# Patient Record
Sex: Female | Born: 1965 | Race: Black or African American | Hispanic: No | Marital: Single | Smoking: Current every day smoker
Health system: Southern US, Community
[De-identification: ages and names within clinical notes are randomized; demographics above are authoritative.]

## PROBLEM LIST (undated history)

## (undated) DIAGNOSIS — I10 Essential (primary) hypertension: Secondary | ICD-10-CM

## (undated) DIAGNOSIS — F32A Depression, unspecified: Secondary | ICD-10-CM

## (undated) DIAGNOSIS — J449 Chronic obstructive pulmonary disease, unspecified: Secondary | ICD-10-CM

## (undated) DIAGNOSIS — F4310 Post-traumatic stress disorder, unspecified: Secondary | ICD-10-CM

## (undated) DIAGNOSIS — F419 Anxiety disorder, unspecified: Secondary | ICD-10-CM

## (undated) HISTORY — PX: TUBAL LIGATION: SHX77

---

## 2020-03-26 ENCOUNTER — Encounter: Admit: 2020-03-26 | Payer: PRIVATE HEALTH INSURANCE | Attending: Pulmonary Disease

## 2020-03-26 DIAGNOSIS — J449 Chronic obstructive pulmonary disease, unspecified: Secondary | ICD-10-CM

## 2020-04-02 ENCOUNTER — Encounter: Admit: 2020-04-02 | Payer: PRIVATE HEALTH INSURANCE

## 2020-04-02 ENCOUNTER — Inpatient Hospital Stay: Admit: 2020-04-02 | Discharge: 2020-04-02 | Payer: MEDICAID

## 2020-04-02 ENCOUNTER — Emergency Department: Admit: 2020-04-02 | Payer: PRIVATE HEALTH INSURANCE

## 2020-04-02 DIAGNOSIS — R519 Headache, unspecified: Secondary | ICD-10-CM

## 2020-04-02 DIAGNOSIS — F39 Unspecified mood [affective] disorder: Secondary | ICD-10-CM

## 2020-04-02 DIAGNOSIS — F1910 Other psychoactive substance abuse, uncomplicated: Secondary | ICD-10-CM

## 2020-04-02 DIAGNOSIS — J439 Emphysema, unspecified: Secondary | ICD-10-CM

## 2020-04-02 DIAGNOSIS — F32A Depression: Secondary | ICD-10-CM

## 2020-04-02 DIAGNOSIS — J449 Chronic obstructive pulmonary disease, unspecified: Secondary | ICD-10-CM

## 2020-04-02 DIAGNOSIS — F419 Anxiety disorder, unspecified: Secondary | ICD-10-CM

## 2020-04-02 DIAGNOSIS — I10 Essential (primary) hypertension: Secondary | ICD-10-CM

## 2020-04-02 LAB — CBC WITH AUTO DIFFERENTIAL
BKR WAM ABSOLUTE IMMATURE GRANULOCYTES: 0 x 1000/ÂµL (ref 0.0–0.3)
BKR WAM ABSOLUTE LYMPHOCYTE COUNT: 1.8 x 1000/ÂµL (ref 1.0–4.0)
BKR WAM ABSOLUTE NRBC: 0 x 1000/ÂµL (ref 0.0–0.0)
BKR WAM ANALYZER ANC: 5.4 x 1000/ÂµL (ref 1.0–11.0)
BKR WAM BASOPHIL ABSOLUTE COUNT: 0 x 1000/ÂµL (ref 0.0–0.0)
BKR WAM BASOPHILS: 0.5 % (ref 0.0–4.0)
BKR WAM EOSINOPHIL ABSOLUTE COUNT: 0.1 x 1000/ÂµL (ref 0.0–1.0)
BKR WAM EOSINOPHILS: 0.8 % (ref 0.0–7.0)
BKR WAM HEMATOCRIT: 38.1 % (ref 37.0–52.0)
BKR WAM HEMOGLOBIN: 12.6 g/dL (ref 12.0–18.0)
BKR WAM IMMATURE GRANULOCYTES: 0.1 % (ref 0.0–3.0)
BKR WAM LYMPHOCYTES: 23.6 % (ref 8.0–49.0)
BKR WAM MCH (PG): 31 pg (ref 27.0–31.0)
BKR WAM MCHC: 33.1 g/dL (ref 31.0–36.0)
BKR WAM MCV: 93.6 fL (ref 78.0–94.0)
BKR WAM MONOCYTE ABSOLUTE COUNT: 0.4 x 1000/ÂµL (ref 0.0–2.0)
BKR WAM MONOCYTES: 5.5 % (ref 4.0–15.0)
BKR WAM MPV: 9.6 fL (ref 6.0–11.0)
BKR WAM NEUTROPHILS: 69.5 % (ref 37.0–84.0)
BKR WAM NUCLEATED RED BLOOD CELLS: 0 % (ref 0.0–1.0)
BKR WAM PLATELETS: 306 x1000/ÂµL (ref 140–440)
BKR WAM RDW-CV: 13.4 % (ref 11.5–14.5)
BKR WAM RED BLOOD CELL COUNT: 4.1 M/ÂµL (ref 3.8–5.9)
BKR WAM WHITE BLOOD CELL COUNT: 7.8 x1000/ÂµL (ref 4.0–10.0)

## 2020-04-02 LAB — BASIC METABOLIC PANEL
BKR ANION GAP: 10 (ref 7–17)
BKR BLOOD UREA NITROGEN: 12 mg/dL (ref 6–20)
BKR BUN / CREAT RATIO: 12 (ref 8.0–23.0)
BKR CALCIUM: 9.4 mg/dL (ref 8.8–10.2)
BKR CHLORIDE: 104 mmol/L (ref 98–107)
BKR CO2: 25 mmol/L (ref 20–30)
BKR CREATININE: 1 mg/dL (ref 0.40–1.30)
BKR EGFR (AFR AMER): >60 mL/min/{1.73_m2} (ref >60)
BKR EGFR (NON AFRICAN AMERICAN): 58 mL/min/{1.73_m2} (ref >60)
BKR GLUCOSE: 113 mg/dL — ABNORMAL HIGH (ref 70–100)
BKR POTASSIUM: 4.2 mmol/L (ref 3.3–5.1)
BKR SODIUM: 139 mmol/L (ref 136–144)

## 2020-04-02 LAB — HEPATIC FUNCTION PANEL
BKR A/G RATIO: 1.2 (ref 1.0–2.2)
BKR ALANINE AMINOTRANSFERASE (ALT): 21 U/L (ref 10–35)
BKR ALBUMIN: 4.1 g/dL (ref 3.6–4.9)
BKR ALKALINE PHOSPHATASE: 92 U/L (ref 9–122)
BKR ASPARTATE AMINOTRANSFERASE (AST): 30 U/L (ref 10–35)
BKR AST/ALT RATIO: 1.4
BKR BILIRUBIN DIRECT: <0.2 mg/dL (ref <=0.3)
BKR BILIRUBIN TOTAL: 0.2 mg/dL (ref <=1.2)
BKR GLOBULIN: 3.4 g/dL (ref 2.3–3.5)
BKR PROTEIN TOTAL: 7.5 g/dL (ref 6.6–8.7)

## 2020-04-02 LAB — PROTIME AND INR
BKR INR: 0.91 (ref 0.90–1.09)
BKR PROTHROMBIN TIME: 10.2 s (ref 10.2–12.2)

## 2020-04-02 MED ORDER — SODIUM CHLORIDE 0.9 % BOLUS (NEW BAG)
0.9 % | INTRAVENOUS | Status: CP
Start: 2020-04-02 — End: ?
  Administered 2020-04-02: 20:00:00 0.9 mL/h via INTRAVENOUS

## 2020-04-02 MED ORDER — METOCLOPRAMIDE 5 MG/ML INJECTION SOLUTION
5 mg/mL | INTRAVENOUS | Status: CP
Start: 2020-04-02 — End: ?
  Administered 2020-04-02: 20:00:00 5 mL via INTRAVENOUS

## 2020-04-02 MED ORDER — ACETAMINOPHEN 325 MG TABLET
325 mg | Freq: Once | ORAL | Status: CP
Start: 2020-04-02 — End: ?
  Administered 2020-04-02: 20:00:00 325 mg via ORAL

## 2020-04-02 MED ORDER — LABETALOL 5 MG/ML INTRAVENOUS SOLUTION
5 mg/mL | Freq: Once | INTRAVENOUS | Status: CP
Start: 2020-04-02 — End: ?
  Administered 2020-04-02: 20:00:00 5 mL via INTRAVENOUS

## 2020-04-02 MED ORDER — HYDROCHLOROTHIAZIDE 25 MG TABLET
25 mg | ORAL_TABLET | Freq: Every day | ORAL | 1 refills | Status: AC
Start: 2020-04-02 — End: ?

## 2020-04-02 NOTE — ED Notes
2:39 PM Pt presents from PCP for evaluation of HTN. Pt has been non complaint with BP meds over the last year. Pt was hypertensive at clinic and sent in to ED for eval.  States over the last month pt has had worsening headaches and intermittent blurred vision in left eye more than the right. Pt presents to ED speaking in full sentences. Denies CP, SOB, abd pain, N/V/D, fevers/chills, Appears in NAD. Pt admits to crack cocaine use last night and daily marijuana use. Pt distracted by cell phone during intial interview. Will continue to monitor. Awaiting MD eval 2:44 PMPA bedside for eval

## 2020-04-02 NOTE — ED Notes
5:23 PM Airyn Ellzey, RNPt medicated per MAR, pending imaging results. 5:58 PMPt expressing relief of symptoms including headache. Pt BP seen to minimally 7:06 PMReport given to [facility].

## 2020-04-02 NOTE — Discharge Instructions
Return to ED if you have fever, chest pain, dizziness, shortness of breath, leg pain/swelling

## 2020-04-03 DIAGNOSIS — I10 Essential (primary) hypertension: Secondary | ICD-10-CM

## 2020-04-03 DIAGNOSIS — H538 Other visual disturbances: Secondary | ICD-10-CM

## 2020-04-03 DIAGNOSIS — F17290 Nicotine dependence, other tobacco product, uncomplicated: Secondary | ICD-10-CM

## 2020-04-03 DIAGNOSIS — Z79899 Other long term (current) drug therapy: Secondary | ICD-10-CM

## 2020-04-03 DIAGNOSIS — R519 Headache, unspecified: Secondary | ICD-10-CM

## 2020-04-03 DIAGNOSIS — F17210 Nicotine dependence, cigarettes, uncomplicated: Secondary | ICD-10-CM

## 2020-04-03 DIAGNOSIS — J439 Emphysema, unspecified: Secondary | ICD-10-CM

## 2020-04-04 ENCOUNTER — Encounter: Admit: 2020-04-04 | Payer: PRIVATE HEALTH INSURANCE | Attending: Acute Care

## 2020-04-04 DIAGNOSIS — Z01818 Encounter for other preprocedural examination: Secondary | ICD-10-CM

## 2020-04-15 NOTE — ED Provider Notes
HistoryChief Complaint Patient presents with ? Headache- New Onset Or New Symptoms   Patient complaining of a headache. Found to have a BP of 195/116. Patient has not taken BP meds in over a year.   33f hx anxiety, copd, current smoker, htn, depression, mood disorder, crack cocaine user (last use was last night), here with 2 weeks of new hs's that are intermittent L occip ha that comes out of nowhere and is 8/10, assoc w 2 weeks of constant L eye blurry vision- describes as a static like her eye is about to pass out, sometimes has flashing lights to the eye, no eye pain- reports a few days ago she had bright red to the white part of the L eye which is now resolved. She was put on hctz 1 year ago but never took it. Called her doctors office 2 weeks ago when her ha's began and they sent her a bp cuff to monitor pressures- bp has been 200/100's so was directed to the ed for further eval. Admits to intermittent pulling pressure like pain to chest, none now. Denies dizziness, paresthesias, focal weakness, gait change, palpitations, cough, sob, fever, chills, n/v, abd pain, constipation, diarrhea, urinary sxs, leg pain/swelling. The history is provided by the patient. No language interpreter was used. HeadacheThis is a new problem. The current episode started more than 1 week ago. The problem has not changed since onset.Associated symptoms include chest pain and headaches. Pertinent negatives include no abdominal pain and no shortness of breath. Nothing aggravates the symptoms. Nothing relieves the symptoms. She has tried nothing for the symptoms.  Past Medical History: Diagnosis Date ? Anxiety  ? COPD (chronic obstructive pulmonary disease) (HC Code)  ? Depression  ? Emphysema  ? HTN (hypertension)  ? Mood disorder (HC Code)  ? Substance abuse (HC Code)  Past Surgical History: Procedure Laterality Date ? TUBAL LIGATION   Family History Problem Relation Age of Onset ? Alcohol abuse Mother  ? Drug abuse Mother  ? Depression Mother  ? Heart attack Mother       cause ? Hypertension Mother  ? Lung cancer Mother 8 ? Ovarian cancer Mother 92 ? Drug abuse Father  ? Mental illness Father  ? Heart attack Maternal Grandfather  ? Diabetes Maternal Grandfather  ? Kidney disease Maternal Grandfather  ? Hypertension Sister  ? Depression Sister  ? Breast cancer Neg Hx  Social History Socioeconomic History ? Marital status: Single   Spouse name: Not on file ? Number of children: Not on file ? Years of education: Not on file ? Highest education level: Not on file Tobacco Use ? Smoking status: Current Every Day Smoker   Packs/day: 1.00   Types: Cigarettes ? Smokeless tobacco: Current User Substance and Sexual Activity ? Alcohol use: Yes   Frequency: 2-4 times a month   Comment: when drinking ? Drug use: Yes   Types: Crack cocaine, Marijuana   Comment: Marijuana everyday and crack every other day.  ? Sexual activity: Not Currently   Partners: Male   Birth control/protection: Surgical Social History Narrative  Substance abuse : crack cocaine x 30+ years: in group therpy ED Other Social History ? E-cigarette status Never User  ? E-Cigarette Use Never User  E-cigarette/Vaping Substances E-cigarette/Vaping Devices Review of Systems Respiratory: Negative for shortness of breath.  Cardiovascular: Positive for chest pain. Gastrointestinal: Negative for abdominal pain. Neurological: Positive for headaches. All other systems reviewed and are negative. Physical ExamED Triage Vitals [04/02/20 1421]BP: (!) 195/116Pulse: 70Pulse from  O2 sat: n/aResp: 16Temp: 97.9 ?F (36.6 ?C)Temp src: OralSpO2: 99 % BP (!) 176/90  - Pulse 62  - Temp 98.1 ?F (36.7 ?C) (Oral)  - Resp 18  - Wt 98.9 kg  - LMP  (LMP Unknown)  - SpO2 100%  - BMI 35.73 kg/m? Physical ExamVitals signs and nursing note reviewed. Constitutional:     General: She is not in acute distress.   Appearance: Normal appearance. She is normal weight. She is not ill-appearing, toxic-appearing or diaphoretic. HENT:    Head: Normocephalic and atraumatic.    Right Ear: External ear normal.    Left Ear: External ear normal.    Nose: Nose normal.    Mouth/Throat:    Mouth: Mucous membranes are moist.    Pharynx: Oropharynx is clear. Eyes:    General: No scleral icterus.      Right eye: No discharge.       Left eye: No discharge.    Extraocular Movements: Extraocular movements intact.    Conjunctiva/sclera: Conjunctivae normal.    Pupils: Pupils are equal, round, and reactive to light.    Comments: No nystagmus, nonpainful eom'sIOP to the L eye 14, 15 Visual acuity: R and L eye 20/25 without correction attmepted fundoscopic exam but unable to visualize retina- appears cloudy, could be cataract Neck:    Musculoskeletal: Normal range of motion and neck supple. Cardiovascular:    Rate and Rhythm: Normal rate and regular rhythm.    Pulses: Normal pulses.    Heart sounds: Normal heart sounds. No murmur. No friction rub. No gallop.  Pulmonary:    Effort: Pulmonary effort is normal. No respiratory distress.    Breath sounds: Normal breath sounds. No stridor. No wheezing, rhonchi or rales. Chest:    Chest wall: No tenderness. Abdominal:    General: Abdomen is flat. Bowel sounds are normal. There is no distension.    Palpations: Abdomen is soft.    Tenderness: There is no abdominal tenderness. There is no right CVA tenderness or left CVA tenderness. Musculoskeletal: Normal range of motion.       General: No swelling or tenderness. Skin:   General: Skin is warm and dry.    Capillary Refill: Capillary refill takes less than 2 seconds. Neurological:    General: No focal deficit present.    Mental Status: She is alert and oriented to person, place, and time.    GCS: GCS eye subscore is 4. GCS verbal subscore is 5. GCS motor subscore is 6.    Cranial Nerves: Cranial nerves are intact.    Sensory: Sensation is intact.    Motor: Motor function is intact.    Coordination: Coordination is intact. Romberg sign negative. Finger-Nose-Finger Test normal.    Gait: Gait is intact. Gait and tandem walk normal.    Comments: No driftNo neglect Psychiatric:       Mood and Affect: Mood normal.       Behavior: Behavior normal.  ProceduresProceduresResident/APP ZOX:[id number] & Plan59f here with new onse L ha with L blurry vision for 2 weeks , has been having elevated bp's since monitoring Ddx: htnsive urgency, uncontrolled htn, migraine, tension ha, ich, brain mass, pheochromocytoma unlikelyP: labs, cxr, cth, ua, ekg, migraine cocktail, reassessD/w dr Frederik Pear afte rmeds pt bp improved 176/90, she feels improved and is eager to go home, will have her f/u w [facility] for likely cataract ED CourseClinical Impressions as of Apr 02 1920 Nonintractable headache, unspecified chronicity pattern, unspecified headache type Hypertension, unspecified type Change  in vision  ED DispositionDischarge McNiff, Lucilla Lame, PA06/08/21 1921ED Attestation: PA/APRNFace to face evaluation was performed by me in collaboration with the Advanced Practice Provider to assess for significant health threats.On my exam: nonfocal neuro exam, treated for HA, HTN with improvementMy differential includes: migraine, tension HA, hypertensive urgency vs emergencyJeffrey Jillene Bucks, MD06/21/21 1042

## 2020-04-18 ENCOUNTER — Ambulatory Visit: Admit: 2020-04-18 | Payer: PRIVATE HEALTH INSURANCE

## 2020-04-19 ENCOUNTER — Encounter: Admit: 2020-04-19 | Payer: PRIVATE HEALTH INSURANCE | Attending: Acute Care

## 2020-04-19 DIAGNOSIS — Z01818 Encounter for other preprocedural examination: Secondary | ICD-10-CM

## 2020-04-26 ENCOUNTER — Ambulatory Visit: Admit: 2020-04-26 | Payer: PRIVATE HEALTH INSURANCE

## 2020-07-19 ENCOUNTER — Emergency Department (HOSPITAL_COMMUNITY): Payer: Medicaid - Out of State

## 2020-07-19 ENCOUNTER — Other Ambulatory Visit: Payer: Self-pay

## 2020-07-19 ENCOUNTER — Encounter (HOSPITAL_COMMUNITY): Payer: Self-pay | Admitting: Emergency Medicine

## 2020-07-19 ENCOUNTER — Emergency Department (HOSPITAL_COMMUNITY)
Admission: EM | Admit: 2020-07-19 | Discharge: 2020-07-20 | Disposition: A | Discharge status: Home / Self Care | Payer: Medicaid - Out of State | Attending: Emergency Medicine | Admitting: Emergency Medicine

## 2020-07-19 DIAGNOSIS — S61210A Laceration without foreign body of right index finger without damage to nail, initial encounter: Secondary | ICD-10-CM | POA: Diagnosis present

## 2020-07-19 DIAGNOSIS — S61200A Unspecified open wound of right index finger without damage to nail, initial encounter: Secondary | ICD-10-CM | POA: Insufficient documentation

## 2020-07-19 DIAGNOSIS — W260XXA Contact with knife, initial encounter: Secondary | ICD-10-CM | POA: Diagnosis not present

## 2020-07-19 DIAGNOSIS — Y93G3 Activity, cooking and baking: Secondary | ICD-10-CM | POA: Diagnosis not present

## 2020-07-19 DIAGNOSIS — Z23 Encounter for immunization: Secondary | ICD-10-CM | POA: Diagnosis not present

## 2020-07-19 DIAGNOSIS — S61209A Unspecified open wound of unspecified finger without damage to nail, initial encounter: Secondary | ICD-10-CM

## 2020-07-19 MED ORDER — TETANUS-DIPHTH-ACELL PERTUSSIS 5-2.5-18.5 LF-MCG/0.5 IM SUSP
0.5000 mL | Freq: Once | INTRAMUSCULAR | Status: AC
  Administered 2020-07-20: 0.5 mL via INTRAMUSCULAR
  Filled 2020-07-19: qty 0.5

## 2020-07-19 NOTE — ED Provider Notes (Signed)
[facility]-EMERGENCY DEPT [facility] Emergency Department Provider Note MRN:  [id number]  Arrival date & time: 07/19/20     Chief Complaint   Extremity Laceration   History of Present Illness   Joyce Ballard is a 54 y.o. year-old female with no pertinent past medical history presenting to the ED with chief complaint of extremity laceration.  Right pointer finger cut with a knife while patient was cutting vegetables.  Unable to stop the bleeding since that time.  Denies any other injuries, unknown timing of last tetanus shot.  Pain is mild to moderate, constant, worse with motion or palpation.  Review of Systems  A problem-focused ROS was performed. Positive for finger laceration.  Patient denies head trauma.  Patient's Health History   History reviewed. No pertinent past medical history.  History reviewed. No pertinent surgical history.  History reviewed. No pertinent family history.  Social History   Socioeconomic History  . Marital status: Single    Spouse name: Not on file  . Number of children: Not on file  . Years of education: Not on file  . Highest education level: Not on file  Occupational History  . Not on file  Tobacco Use  . Smoking status: Never Smoker  . Smokeless tobacco: Never Used  Vaping Use  . Vaping Use: Never used  Substance and Sexual Activity  . Alcohol use: Never  . Drug use: Never  . Sexual activity: Not on file  Other Topics Concern  . Not on file  Social History Narrative  . Not on file   Social Determinants of Health   Financial Resource Strain:   . Difficulty of Paying Living Expenses: Not on file  Food Insecurity:   . Worried About Programme researcher, broadcasting/film/video in the Last Year: Not on file  . Ran Out of Food in the Last Year: Not on file  Transportation Needs:   . Lack of Transportation (Medical): Not on file  . Lack of Transportation (Non-Medical): Not on file  Physical Activity:   . Days of Exercise per Week: Not on file  . Minutes of  Exercise per Session: Not on file  Stress:   . Feeling of Stress : Not on file  Social Connections:   . Frequency of Communication with Friends and Family: Not on file  . Frequency of Social Gatherings with Friends and Family: Not on file  . Attends Religious Services: Not on file  . Active Member of Clubs or Organizations: Not on file  . Attends Banker Meetings: Not on file  . Marital Status: Not on file  Intimate Partner Violence:   . Fear of Current or Ex-Partner: Not on file  . Emotionally Abused: Not on file  . Physically Abused: Not on file  . Sexually Abused: Not on file     Physical Exam   Vitals:   07/19/20 2249  BP: (!) 159/115  Pulse: 84  Resp: 16  Temp: 98.4 F (36.9 C)  SpO2: 100%    CONSTITUTIONAL: Well-appearing, NAD NEURO:  Alert and oriented x 3, no focal deficits EYES:  eyes equal and reactive ENT/NECK:  no LAD, no JVD CARDIO: Regular rate, well-perfused, normal S1 and S2 PULM:  CTAB no wheezing or rhonchi GI/GU:  normal bowel sounds, non-distended, non-tender MSK/SPINE:  No gross deformities, no edema SKIN: Partial fingertip avulsion involving lateral aspect of right pointer finger involving small portion of fingernail PSYCH:  Appropriate speech and behavior  *Additional and/or pertinent findings included in MDM below  Diagnostic and Interventional Summary    EKG Interpretation  Date/Time:    Ventricular Rate:    PR Interval:    QRS Duration:   QT Interval:    QTC Calculation:   R Axis:     Text Interpretation:        Labs Reviewed - No data to display  DG Finger Index Right  Final Result      Medications  Tdap (BOOSTRIX) injection 0.5 mL (has no administration in time range)     Procedures  /  Critical Care .Marland KitchenLaceration Repair  Date/Time: 07/19/2020 11:55 PM Performed by: Sabas Sous, MD Authorized by: Sabas Sous, MD   Consent:    Consent obtained:  Verbal   Consent given by:  Patient   Risks  discussed:  Infection, need for additional repair, nerve damage, poor wound healing, poor cosmetic result, pain, vascular damage, tendon damage and retained foreign body Anesthesia (see MAR for exact dosages):    Anesthesia method:  None Laceration details:    Location:  Finger   Finger location:  R index finger   Length (cm):  2   Depth (mm):  1 Repair type:    Repair type:  Simple Pre-procedure details:    Preparation:  Imaging obtained to evaluate for foreign bodies and patient was prepped and draped in usual sterile fashion Exploration:    Hemostasis achieved with:  Tourniquet   Wound exploration: wound explored through full range of motion and entire depth of wound probed and visualized     Contaminated: no   Treatment:    Area cleansed with:  Soap and water   Amount of cleaning:  Standard Skin repair:    Repair method:  Tissue adhesive Post-procedure details:    Dressing:  Non-adherent dressing   Patient tolerance of procedure:  Tolerated well, no immediate complications Comments:     Partial fingertip avulsion, tourniquet applied to obtain hemostasis and then Dermabond placed to cover the wound.  No bleeding after tourniquet removal, good cap refill distally.    ED Course and Medical Decision Making  I have reviewed the triage vital signs, the nursing notes, and pertinent available records from the EMR.  Listed above are laboratory and imaging tests that I personally ordered, reviewed, and interpreted and then considered in my medical decision making (see below for details).  Partial fingertip avulsion, will seal with Dermabond.     X-ray without fracture or foreign body, procedure as described above, appropriate for discharge.  [name]. Pilar Plate, MD [facility] Emergency Medicine [facility] [email or web address]  Final Clinical Impressions(s) / ED Diagnoses     ICD-10-CM   1. Avulsion of finger tip, initial encounter  P32.951O     ED Discharge  Orders    None       Discharge Instructions Discussed with and Provided to Patient:     Discharge Instructions     You were evaluated in the Emergency Department and after careful evaluation, we did not find any emergent condition requiring admission or further testing in the hospital.  Your exam/testing today was overall reassuring.  We applied medical glue to your finger injury.  This will wear away with time as your finger heals.  Please return to the Emergency Department if you experience any worsening of your condition.  Thank you for allowing [place] to be a part of your care.        Sabas Sous, MD 07/19/20 2356

## 2020-07-19 NOTE — ED Triage Notes (Signed)
Patient complaining of right pointer finger laceration. Patient cut her finger while cutting something.

## 2020-07-19 NOTE — Discharge Instructions (Addendum)
You were evaluated in the Emergency Department and after careful evaluation, we did not find any emergent condition requiring admission or further testing in the hospital.  Your exam/testing today was overall reassuring.  We applied medical glue to your finger injury.  This will wear away with time as your finger heals.  Please return to the Emergency Department if you experience any worsening of your condition.  Thank you for allowing [place] to be a part of your care.

## 2020-10-04 ENCOUNTER — Encounter (HOSPITAL_COMMUNITY): Payer: Self-pay

## 2020-10-04 ENCOUNTER — Emergency Department (HOSPITAL_COMMUNITY): Payer: Medicaid - Out of State

## 2020-10-04 ENCOUNTER — Emergency Department (HOSPITAL_COMMUNITY)
Admission: EM | Admit: 2020-10-04 | Discharge: 2020-10-04 | Disposition: A | Discharge status: Home / Self Care | Payer: Medicaid - Out of State | Attending: Emergency Medicine | Admitting: Emergency Medicine

## 2020-10-04 ENCOUNTER — Other Ambulatory Visit: Payer: Self-pay

## 2020-10-04 DIAGNOSIS — J449 Chronic obstructive pulmonary disease, unspecified: Secondary | ICD-10-CM | POA: Diagnosis not present

## 2020-10-04 DIAGNOSIS — R110 Nausea: Secondary | ICD-10-CM | POA: Diagnosis not present

## 2020-10-04 DIAGNOSIS — R079 Chest pain, unspecified: Secondary | ICD-10-CM

## 2020-10-04 DIAGNOSIS — R0789 Other chest pain: Secondary | ICD-10-CM | POA: Diagnosis not present

## 2020-10-04 DIAGNOSIS — I10 Essential (primary) hypertension: Secondary | ICD-10-CM

## 2020-10-04 DIAGNOSIS — F17210 Nicotine dependence, cigarettes, uncomplicated: Secondary | ICD-10-CM | POA: Insufficient documentation

## 2020-10-04 HISTORY — DX: Depression, unspecified: F32.A

## 2020-10-04 HISTORY — DX: Chronic obstructive pulmonary disease, unspecified: J44.9

## 2020-10-04 HISTORY — DX: Essential (primary) hypertension: I10

## 2020-10-04 HISTORY — DX: Post-traumatic stress disorder, unspecified: F43.10

## 2020-10-04 HISTORY — DX: Anxiety disorder, unspecified: F41.9

## 2020-10-04 LAB — CBC
HCT: 39.4 % (ref 36.0–46.0)
Hemoglobin: 13.3 g/dL (ref 12.0–15.0)
MCH: 31.4 pg (ref 26.0–34.0)
MCHC: 33.8 g/dL (ref 30.0–36.0)
MCV: 92.9 fL (ref 80.0–100.0)
Platelets: 344 10*3/uL (ref 150–400)
RBC: 4.24 MIL/uL (ref 3.87–5.11)
RDW: 13.2 % (ref 11.5–15.5)
WBC: 5.8 10*3/uL (ref 4.0–10.5)
nRBC: 0 % (ref 0.0–0.2)

## 2020-10-04 LAB — BASIC METABOLIC PANEL
Anion gap: 11 (ref 5–15)
BUN: 12 mg/dL (ref 6–20)
CO2: 23 mmol/L (ref 22–32)
Calcium: 9.4 mg/dL (ref 8.9–10.3)
Chloride: 102 mmol/L (ref 98–111)
Creatinine, Ser: 0.92 mg/dL (ref 0.44–1.00)
GFR, Estimated: >60 mL/min (ref >60)
Glucose, Bld: 114 mg/dL — ABNORMAL HIGH (ref 70–99)
Potassium: 3.9 mmol/L (ref 3.5–5.1)
Sodium: 136 mmol/L (ref 135–145)

## 2020-10-04 LAB — TROPONIN I (HIGH SENSITIVITY): Troponin I (High Sensitivity): 2 ng/L (ref <18)

## 2020-10-04 NOTE — ED Provider Notes (Signed)
[facility]-EMERGENCY DEPT Provider Note   CSN: [id number] Arrival date & time: 10/04/20  1302     History Chief Complaint  Patient presents with   Hypertension   Chest Pain    Joyce Ballard is a 54 y.o. female hx of COPD, HTN, PTSD, here with hypertension, chest pain.  Patient states that she has been having some nausea for the last several days.  She states that there is some burning chest pain as well.  She also has not been taking her hydrochlorothiazide and her blood pressure has been running in the 150s.  She went to urgent care today and was prescribed hydrochlorothiazide and sent here for rule out ACS.  Patient denies any shortness of breath.  She has no previous history of CAD or stents.  The history is provided by the patient.       Past Medical History:  Diagnosis Date   Anxiety    COPD (chronic obstructive pulmonary disease) (HCC)    Depression    Hypertension    PTSD (post-traumatic stress disorder)     There are no problems to display for this patient.   Past Surgical History:  Procedure Laterality Date   TUBAL LIGATION       OB History   No obstetric history on file.     Family History  Problem Relation Age of Onset   Hypertension Mother    Cancer Mother    Depression Mother    Post-traumatic stress disorder Mother     Social History   Tobacco Use   Smoking status: Current Every Day Smoker    Packs/day: 1.00    Types: Cigarettes   Smokeless tobacco: Never Used  Vaping Use   Vaping Use: Never used  Substance Use Topics   Alcohol use: Yes   Drug use: Yes    Types: Marijuana    Home Medications Prior to Admission medications   Not on File    Allergies    Patient has no known allergies.  Review of Systems   Review of Systems  Cardiovascular: Positive for chest pain.  All other systems reviewed and are negative.   Physical Exam Updated Vital Signs BP 137/90 (BP Location: Left Arm)     Pulse 73    Temp 97.8 F (36.6 C) (Oral)    Resp 19    Ht 5\' 5"  (1.651 m)    Wt 89.8 kg    SpO2 100%    BMI 32.95 kg/m   Physical Exam Vitals and nursing note reviewed.  Constitutional:      Appearance: She is well-developed.  HENT:     Head: Normocephalic.  Eyes:     Extraocular Movements: Extraocular movements intact.     Pupils: Pupils are equal, round, and reactive to light.  Cardiovascular:     Rate and Rhythm: Normal rate and regular rhythm.     Heart sounds: Normal heart sounds.  Pulmonary:     Effort: Pulmonary effort is normal.     Breath sounds: Normal breath sounds.  Abdominal:     General: Bowel sounds are normal.     Palpations: Abdomen is soft.  Musculoskeletal:        General: Normal range of motion.     Cervical back: Normal range of motion and neck supple.  Skin:    General: Skin is warm and dry.     Capillary Refill: Capillary refill takes less than 2 seconds.  Neurological:     General: No  focal deficit present.     Mental Status: She is alert.     ED Results / Procedures / Treatments   Labs (all labs ordered are listed, but only abnormal results are displayed) Labs Reviewed  BASIC METABOLIC PANEL - Abnormal; Notable for the following components:      Result Value   Glucose, Bld 114 (*)    All other components within normal limits  CBC  TROPONIN I (HIGH SENSITIVITY)  TROPONIN I (HIGH SENSITIVITY)    EKG EKG Interpretation  Date/Time:  Friday October 04 2020 13:19:05 EST Ventricular Rate:  73 PR Interval:    QRS Duration: 95 QT Interval:  394 QTC Calculation: 435 R Axis:   40 Text Interpretation: Sinus rhythm Minimal ST elevation, anterior leads 12 Lead; Mason-Likar No previous ECGs available Confirmed by Richardean Canal [phone]) on 10/04/2020 4:09:53 PM   Radiology DG Chest 2 View  Result Date: 10/04/2020 CLINICAL DATA:  Chest pain. EXAM: CHEST - 2 VIEW COMPARISON:  None. FINDINGS: The heart size and mediastinal contours are within  normal limits. Both lungs are clear. No visible pleural effusions or pneumothorax. No acute osseous abnormality. IMPRESSION: No active cardiopulmonary disease. Electronically Signed   By: Feliberto Harts MD   On: 10/04/2020 14:52    Procedures Procedures (including critical care time)  Medications Ordered in ED Medications - No data to display  ED Course  I have reviewed the triage vital signs and the nursing notes.  Pertinent labs & imaging results that were available during my care of the patient were reviewed by me and considered in my medical decision making (see chart for details).    MDM Rules/Calculators/A&P                         [name] is a 54 y.o. female who presented with chest pain.  Pain has been going on for last 3 days.  I suspect reflux.  Patient is well-appearing and has no history of CAD.  I doubt PE either.  I think likely's reflux versus symptomatic hypertension.  Plan to get CBC, BMP, troponin x1, chest x-ray  4:14 PM Troponin unremarkable and labs and chest x-ray and EKG unremarkable. She has HCTZ prescribed already and BP down to 137/90. Stable for discharge   Final Clinical Impression(s) / ED Diagnoses Final diagnoses:  None    Rx / DC Orders ED Discharge Orders    None       Charlynne Pander, MD 10/04/20 1614

## 2020-10-04 NOTE — ED Triage Notes (Addendum)
Patient c.o hypertension and intermittent mid upper abdominal and mid chest pain x 3 days.  Patient states she has not taken her BP meds in 4 years.

## 2020-10-04 NOTE — Discharge Instructions (Signed)
Please take your hydrochlorothiazide as prescribed by your doctor.  Your heart enzyme test is normal today.  See your doctor for follow-up  Return to ER if you have worse chest pain, trouble breathing, abdominal pain

## 2021-08-08 ENCOUNTER — Ambulatory Visit
Admission: EM | Admit: 2021-08-08 | Discharge: 2021-08-08 | Disposition: A | Discharge status: Home / Self Care | Payer: Medicaid - Out of State | Attending: Emergency Medicine | Admitting: Emergency Medicine

## 2021-08-08 DIAGNOSIS — I10 Essential (primary) hypertension: Secondary | ICD-10-CM

## 2021-08-08 DIAGNOSIS — R5381 Other malaise: Secondary | ICD-10-CM

## 2021-08-08 DIAGNOSIS — R0602 Shortness of breath: Secondary | ICD-10-CM

## 2021-08-08 MED ORDER — SPIRIVA RESPIMAT 2.5 MCG/ACT IN AERS: 2.0000 | INHALATION_SPRAY | Freq: Every day | RESPIRATORY_TRACT | 0 refills | Status: DC

## 2021-08-08 MED ORDER — ALBUTEROL SULFATE HFA 108 (90 BASE) MCG/ACT IN AERS: 1.0000 | INHALATION_SPRAY | Freq: Four times a day (QID) | RESPIRATORY_TRACT | 0 refills | Status: DC | PRN

## 2021-08-08 MED ORDER — IBUPROFEN 800 MG PO TABS
800.0000 mg | ORAL_TABLET | Freq: Once | ORAL | Status: AC
  Administered 2021-08-08: 800 mg via ORAL

## 2021-08-08 NOTE — ED Triage Notes (Signed)
Patient reports having SOB (states she has COPD and has not been using her rescue inhaler or Spiriva- due to not having). Patient states she does not take blood pressure medications.

## 2021-08-08 NOTE — Discharge Instructions (Addendum)
I provided you with renewals of your prescriptions for Spiriva 2.5 and Proventil HFA.  We will notify you by email of the results of your COVID and flu testing today.  The turnaround time on this test is 12 to 24 hours.

## 2021-08-08 NOTE — ED Notes (Signed)
EKG complete.

## 2021-08-08 NOTE — ED Provider Notes (Signed)
[facility]    CSN: [id number] Arrival date & time: 08/08/21  1209      History   Chief Complaint Chief Complaint  Patient presents with   Shortness of Breath    HPI Joyce Ballard is a 55 y.o. female.   Patient reports having SOB (states she has COPD and has not been using her rescue inhaler or Spiriva- due to not having). Patient states she does not take blood pressure medications.  Vital signs reviewed, all are normal today.  EKG performed on arrival, findings are essentially normal, unchanged from previous performed December 2021.  Patient is requesting ibuprofen 800 mg because she states she feels badly.  Patient states she would like to be tested for COVID and flu so she can to return to work and tell them that she does not have either one.  Patient denies fever, aches, chills, nausea, vomiting, diarrhea, cough, loss of taste or smell, sore throat, headache.  The history is provided by the patient.   Past Medical History:  Diagnosis Date   Anxiety    COPD (chronic obstructive pulmonary disease) (HCC)    Depression    Hypertension    PTSD (post-traumatic stress disorder)     There are no problems to display for this patient.   Past Surgical History:  Procedure Laterality Date   TUBAL LIGATION      OB History   No obstetric history on file.      Home Medications    Prior to Admission medications   Medication Sig Start Date End Date Taking? Authorizing Provider  albuterol (VENTOLIN HFA) 108 (90 Base) MCG/ACT inhaler Inhale 1-2 puffs into the lungs every 6 (six) hours as needed for wheezing or shortness of breath. 08/08/21  Yes Theadora Rama Scales, PA-C  Tiotropium Bromide Monohydrate (SPIRIVA RESPIMAT) 2.5 MCG/ACT AERS Inhale 2 puffs into the lungs daily. 08/08/21 09/07/21 Yes Theadora Rama Scales, PA-C    Family History Family History  Problem Relation Age of Onset   Hypertension Mother    Cancer Mother    Depression Mother     Post-traumatic stress disorder Mother     Social History Social History   Tobacco Use   Smoking status: Every Day    Packs/day: 1.00    Types: Cigarettes   Smokeless tobacco: Never  Vaping Use   Vaping Use: Never used  Substance Use Topics   Alcohol use: Yes   Drug use: Yes    Types: Marijuana     Allergies   Patient has no known allergies.   Review of Systems Review of Systems Pertinent findings noted in history of present illness.    Physical Exam Triage Vital Signs ED Triage Vitals [08/08/21 1216]  Enc Vitals Group     BP      Pulse Rate (!) 115     Resp 20     Temp      Temp src      SpO2 99 %     Weight      Height      Head Circumference      Peak Flow      Pain Score      Pain Loc      Pain Edu?      Excl. in GC?    No data found.  Updated Vital Signs BP 117/79 (BP Location: Right Arm)   Pulse 88   Temp 98.3 F (36.8 C) (Oral)   Resp 20   Wt 156  lb 8 oz (71 kg)   SpO2 98%   BMI 26.04 kg/m   Visual Acuity Right Eye Distance:   Left Eye Distance:   Bilateral Distance:    Right Eye Near:   Left Eye Near:    Bilateral Near:     Physical Exam Vitals and nursing note reviewed.  Constitutional:      Appearance: Normal appearance.  HENT:     Head: Normocephalic and atraumatic.     Right Ear: Tympanic membrane, ear canal and external ear normal.     Left Ear: Tympanic membrane, ear canal and external ear normal.     Nose: Nose normal.     Mouth/Throat:     Mouth: Mucous membranes are moist.     Pharynx: Oropharynx is clear.  Eyes:     Extraocular Movements: Extraocular movements intact.     Conjunctiva/sclera: Conjunctivae normal.     Pupils: Pupils are equal, round, and reactive to light.  Cardiovascular:     Rate and Rhythm: Normal rate and regular rhythm.     Pulses: Normal pulses.     Heart sounds: Normal heart sounds.  Pulmonary:     Effort: Pulmonary effort is normal.     Breath sounds: Normal breath sounds.  Abdominal:      General: Abdomen is flat. Bowel sounds are normal.     Palpations: Abdomen is soft.  Musculoskeletal:        General: Normal range of motion.     Cervical back: Normal range of motion and neck supple.  Skin:    General: Skin is warm and dry.  Neurological:     General: No focal deficit present.     Mental Status: She is alert and oriented to person, place, and time. Mental status is at baseline.  Psychiatric:        Mood and Affect: Mood normal.        Behavior: Behavior normal.     UC Treatments / Results  Labs (all labs ordered are listed, but only abnormal results are displayed) Labs Reviewed - No data to display  EKG   Radiology No results found.  Procedures Procedures (including critical care time)  Medications Ordered in UC Medications  ibuprofen (ADVIL) tablet 800 mg (has no administration in time range)    Initial Impression / Assessment and Plan / UC Course  I have reviewed the triage vital signs and the nursing notes.  Pertinent labs & imaging results that were available during my care of the patient were reviewed by me and considered in my medical decision making (see chart for details).     Physical exam today is unremarkable.  EKG is normal.  Patient was provided with a one-time refill of her Spiriva and Proventil.  Patient advised results of COVID tests and flu test we made available to her once received.  Patient verbalized understanding and agreement of plan as discussed.  All questions were addressed during visit.  Please see discharge instructions below for further details of plan.  Final Clinical Impressions(s) / UC Diagnoses   Final diagnoses:  Shortness of breath  Essential hypertension     Discharge Instructions      I provided you with renewals of your prescriptions for Spiriva 2.5 and Proventil HFA.  We will notify you by email of the results of your COVID and flu testing today.  The turnaround time on this test is 12 to 24  hours.     ED Prescriptions  Medication Sig Dispense Auth. Provider   Tiotropium Bromide Monohydrate (SPIRIVA RESPIMAT) 2.5 MCG/ACT AERS Inhale 2 puffs into the lungs daily. 4 g Theadora Rama Scales, PA-C   albuterol (VENTOLIN HFA) 108 (90 Base) MCG/ACT inhaler Inhale 1-2 puffs into the lungs every 6 (six) hours as needed for wheezing or shortness of breath. 18 g Theadora Rama Scales, PA-C      PDMP not reviewed this encounter.   Theadora Rama Scales, PA-C 08/08/21 1426

## 2021-08-09 LAB — COVID-19, FLU A+B NAA
Influenza A, NAA: NOT DETECTED
Influenza B, NAA: NOT DETECTED
SARS-CoV-2, NAA: NOT DETECTED

## 2021-08-14 ENCOUNTER — Telehealth: Payer: Self-pay | Admitting: Emergency Medicine

## 2021-08-14 MED ORDER — ALBUTEROL SULFATE HFA 108 (90 BASE) MCG/ACT IN AERS: 1.0000 | INHALATION_SPRAY | Freq: Four times a day (QID) | RESPIRATORY_TRACT | 0 refills | Status: DC | PRN

## 2021-08-14 NOTE — Telephone Encounter (Signed)
Patient states pharmacy never received original prescription.  Will resend to pharmacy of request

## 2021-10-22 ENCOUNTER — Encounter (HOSPITAL_COMMUNITY): Payer: Self-pay | Admitting: *Deleted

## 2021-10-22 ENCOUNTER — Emergency Department (HOSPITAL_COMMUNITY): Payer: Self-pay

## 2021-10-22 ENCOUNTER — Emergency Department (HOSPITAL_COMMUNITY)
Admission: EM | Admit: 2021-10-22 | Discharge: 2021-10-22 | Disposition: A | Discharge status: Home / Self Care | Payer: Self-pay | Attending: Emergency Medicine | Admitting: Emergency Medicine

## 2021-10-22 DIAGNOSIS — J40 Bronchitis, not specified as acute or chronic: Secondary | ICD-10-CM | POA: Insufficient documentation

## 2021-10-22 DIAGNOSIS — F17210 Nicotine dependence, cigarettes, uncomplicated: Secondary | ICD-10-CM | POA: Insufficient documentation

## 2021-10-22 DIAGNOSIS — J449 Chronic obstructive pulmonary disease, unspecified: Secondary | ICD-10-CM | POA: Insufficient documentation

## 2021-10-22 DIAGNOSIS — I10 Essential (primary) hypertension: Secondary | ICD-10-CM | POA: Insufficient documentation

## 2021-10-22 DIAGNOSIS — Z7951 Long term (current) use of inhaled steroids: Secondary | ICD-10-CM | POA: Insufficient documentation

## 2021-10-22 MED ORDER — PREDNISONE 20 MG PO TABS
60.0000 mg | ORAL_TABLET | Freq: Once | ORAL | Status: AC
  Administered 2021-10-22: 09:00:00 60 mg via ORAL
  Filled 2021-10-22: qty 3

## 2021-10-22 MED ORDER — ALBUTEROL SULFATE HFA 108 (90 BASE) MCG/ACT IN AERS
4.0000 | INHALATION_SPRAY | Freq: Once | RESPIRATORY_TRACT | Status: AC
  Administered 2021-10-22: 09:00:00 4 via RESPIRATORY_TRACT
  Filled 2021-10-22: qty 6.7

## 2021-10-22 MED ORDER — PREDNISONE 10 MG PO TABS: 40.0000 mg | ORAL_TABLET | Freq: Every day | ORAL | 0 refills | Status: AC

## 2021-10-22 NOTE — ED Triage Notes (Signed)
To ED for eval of cough for the past month. States she has been seen and given inhaler which she can't find currently. States she has a non-productive cough - which is her complaint today. No fever. Appears in nad. No trouble sleeping.

## 2021-10-22 NOTE — ED Provider Notes (Signed)
[facility] EMERGENCY DEPARTMENT Provider Note   CSN: [id number] Arrival date & time: 10/22/21  5053     History Chief Complaint  Patient presents with   Cough    Joyce Ballard is a 55 y.o. female.   Cough Associated symptoms: no chest pain, no chills, no fever, no headaches, no myalgias, no rash and no shortness of breath   Patient is 55 year old female history of COPD, still smoking, depression hypertension and anxiety  She is presented to emergency room today with complaints of cough states that she has been coughing for the past month denies any hemoptysis no chest pain except when she is coughing does not feel particularly short of breath but does not feel she is breathing normally.  No fevers.  Denies any lightheadedness dizziness nausea vomiting and denies any unilateral or bilateral leg swelling.  No other associated symptoms.  No aggravating or mitigating factors.    Past Medical History:  Diagnosis Date   Anxiety    COPD (chronic obstructive pulmonary disease) (HCC)    Depression    Hypertension    PTSD (post-traumatic stress disorder)     There are no problems to display for this patient.   Past Surgical History:  Procedure Laterality Date   TUBAL LIGATION       OB History   No obstetric history on file.     Family History  Problem Relation Age of Onset   Hypertension Mother    Cancer Mother    Depression Mother    Post-traumatic stress disorder Mother     Social History   Tobacco Use   Smoking status: Every Day    Packs/day: 1.00    Types: Cigarettes   Smokeless tobacco: Never  Vaping Use   Vaping Use: Never used  Substance Use Topics   Alcohol use: Yes   Drug use: Yes    Types: Marijuana    Home Medications Prior to Admission medications   Medication Sig Start Date End Date Taking? Authorizing Provider  predniSONE (DELTASONE) 10 MG tablet Take 4 tablets (40 mg total) by mouth daily for 5 days. 10/22/21 10/27/21  Yes Keimari Orocovis S, PA  albuterol (VENTOLIN HFA) 108 (90 Base) MCG/ACT inhaler Inhale 1-2 puffs into the lungs every 6 (six) hours as needed for wheezing or shortness of breath. 08/14/21   Theadora Rama Scales, PA-C  Tiotropium Bromide Monohydrate (SPIRIVA RESPIMAT) 2.5 MCG/ACT AERS Inhale 2 puffs into the lungs daily. 08/08/21 09/07/21  Theadora Rama Scales, PA-C    Allergies    Patient has no known allergies.  Review of Systems   Review of Systems  Constitutional:  Negative for chills and fever.  HENT:  Negative for congestion.   Eyes:  Negative for pain.  Respiratory:  Positive for cough. Negative for shortness of breath.   Cardiovascular:  Negative for chest pain and leg swelling.  Gastrointestinal:  Negative for abdominal pain and vomiting.  Genitourinary:  Negative for dysuria.  Musculoskeletal:  Negative for myalgias.  Skin:  Negative for rash.  Neurological:  Negative for dizziness and headaches.   Physical Exam Updated Vital Signs BP 135/81 (BP Location: Right Arm)    Pulse 74    Temp 98 F (36.7 C) (Oral)    Resp 14    SpO2 100%   Physical Exam Vitals and nursing note reviewed.  Constitutional:      General: She is not in acute distress. HENT:     Head: Normocephalic and atraumatic.  Nose: Nose normal.  Eyes:     General: No scleral icterus. Cardiovascular:     Rate and Rhythm: Normal rate and regular rhythm.     Pulses: Normal pulses.     Heart sounds: Normal heart sounds.  Pulmonary:     Effort: Pulmonary effort is normal. No respiratory distress.     Breath sounds: No wheezing.     Comments: Faint expiratory wheezing.  Good tidal volumes.  Speaking in full sentences. Abdominal:     Palpations: Abdomen is soft.     Tenderness: There is no abdominal tenderness.  Musculoskeletal:     Cervical back: Normal range of motion.     Right lower leg: No edema.     Left lower leg: No edema.  Skin:    General: Skin is warm and dry.     Capillary Refill:  Capillary refill takes less than 2 seconds.  Neurological:     Mental Status: She is alert. Mental status is at baseline.  Psychiatric:        Mood and Affect: Mood normal.        Behavior: Behavior normal.    ED Results / Procedures / Treatments   Labs (all labs ordered are listed, but only abnormal results are displayed) Labs Reviewed - No data to display  EKG None  Radiology DG Chest 2 View  Result Date: 10/22/2021 CLINICAL DATA:  Cough and shortness of breath for the past month. EXAM: CHEST - 2 VIEW COMPARISON:  Chest x-ray dated October 04, 2020. FINDINGS: The heart size and mediastinal contours are within normal limits. Normal pulmonary vascularity. 1.5 cm nodule projecting over the lingula/right middle lobe on the lateral view, not clearly seen on PA view or the prior study. No focal consolidation, pleural effusion, or pneumothorax. No acute osseous abnormality. IMPRESSION: 1. 1.5 cm nodule projecting over the lingula/right middle lobe on the lateral view, not clearly seen on PA view or the prior study. Recommend noncontrast chest CT for further evaluation. Electronically Signed   By: Obie Dredge M.D.   On: 10/22/2021 09:24    Procedures Procedures   Medications Ordered in ED Medications  albuterol (VENTOLIN HFA) 108 (90 Base) MCG/ACT inhaler 4 puff (4 puffs Inhalation Given 10/22/21 0925)  predniSONE (DELTASONE) tablet 60 mg (60 mg Oral Given 10/22/21 4103)    ED Course  I have reviewed the triage vital signs and the nursing notes.  Pertinent labs & imaging results that were available during my care of the patient were reviewed by me and considered in my medical decision making (see chart for details).    MDM Rules/Calculators/A&P                          Patient did not have an inhaler this was provided to her along with a dose of prednisone.  Prescription was sent home with patient.  Chest x-ray was personally reviewed no acute abnormalities she does have a  pulmonary nodule which will need to be further evaluated by PCP.  I provided her with the  and wellness clinic information for her to follow-up with. She understands that this could be cancerous but also may be benign.  Her wheezing completely resolved with albuterol and prednisone.  She states she is breathing better vital signs within normal limits.  Will discharge home with return precautions instructions to follow-up with PCP.   Final Clinical Impression(s) / ED Diagnoses Final diagnoses:  Bronchitis  Rx / DC Orders ED Discharge Orders          Ordered    predniSONE (DELTASONE) 10 MG tablet  Daily        10/22/21 1009             Solon Augusta [place], [place] 10/22/21 1854    Franne Forts, DO 10/22/21 2121

## 2021-10-22 NOTE — ED Provider Notes (Signed)
Emergency Medicine Provider Triage Evaluation Note  Ajwa Kimberley , a 55 y.o. female  was evaluated in triage.  Pt complains of cough. Has hx of COPD and has been coughing since the first week of December.    Review of Systems  Positive: Cough Negative: Fever   Physical Exam  BP 135/81 (BP Location: Right Arm)    Pulse 74    Temp 98 F (36.7 C) (Oral)    Resp 14    SpO2 100%  Gen:   Awake, no distress   Resp:  Normal effort  MSK:   Moves extremities without difficulty  Other:  Wheezing   Medical Decision Making  Medically screening exam initiated at 9:06 AM.  Appropriate orders placed.  Neaveh Belanger was informed that the remainder of the evaluation will be completed by another provider, this initial triage assessment does not replace that evaluation, and the importance of remaining in the ED until their evaluation is complete.    Gailen Shelter, [place] 10/22/21 0910    Franne Forts, DO 10/22/21 2120

## 2021-10-22 NOTE — ED Notes (Signed)
Pt verbalized discharge instruction understanding. Will pick up scripts at pharmacy and make follow up appointment with health and wellness center today.

## 2021-10-22 NOTE — ED Notes (Signed)
To xray via wheelchair.

## 2021-10-22 NOTE — Discharge Instructions (Addendum)
Call today to make an appointment with the [facility]  Please take your prednisone as prescribed  Drink plenty of water.  Please stop smoking Follow-up return to the emergency room for any new or concerning symptoms.  As we discussed your chest x-ray shows a pulmonary nodule.  This will need to have additional x-ray/CT scan done at the discretion of your primary care provider.

## 2021-11-18 ENCOUNTER — Encounter (HOSPITAL_COMMUNITY): Payer: Self-pay | Admitting: Radiology

## 2021-11-19 ENCOUNTER — Ambulatory Visit: Payer: Self-pay | Attending: Physician Assistant | Admitting: Physician Assistant

## 2021-11-19 ENCOUNTER — Other Ambulatory Visit: Payer: Self-pay

## 2021-11-19 ENCOUNTER — Inpatient Hospital Stay: Payer: Self-pay | Admitting: Physician Assistant

## 2021-11-19 ENCOUNTER — Encounter: Payer: Self-pay | Admitting: Physician Assistant

## 2021-11-19 DIAGNOSIS — J449 Chronic obstructive pulmonary disease, unspecified: Secondary | ICD-10-CM

## 2021-11-19 DIAGNOSIS — R911 Solitary pulmonary nodule: Secondary | ICD-10-CM

## 2021-11-19 DIAGNOSIS — R9389 Abnormal findings on diagnostic imaging of other specified body structures: Secondary | ICD-10-CM

## 2021-11-19 DIAGNOSIS — F17200 Nicotine dependence, unspecified, uncomplicated: Secondary | ICD-10-CM

## 2021-11-19 DIAGNOSIS — I10 Essential (primary) hypertension: Secondary | ICD-10-CM

## 2021-11-19 MED ORDER — ALBUTEROL SULFATE HFA 108 (90 BASE) MCG/ACT IN AERS
1.0000 | INHALATION_SPRAY | Freq: Four times a day (QID) | RESPIRATORY_TRACT | 2 refills | Status: DC | PRN
  Filled 2021-11-19: qty 18, 25d supply, fill #0
  Filled 2022-04-13: qty 18, 25d supply, fill #1

## 2021-11-19 MED ORDER — SPIRIVA RESPIMAT 2.5 MCG/ACT IN AERS
2.0000 | INHALATION_SPRAY | Freq: Every day | RESPIRATORY_TRACT | 2 refills | Status: DC
  Filled 2021-11-19: qty 4, 25d supply, fill #0
  Filled 2022-04-13 – 2022-06-18 (×2): qty 4, 25d supply, fill #1

## 2021-11-19 MED ORDER — HYDROCHLOROTHIAZIDE 25 MG PO TABS
25.0000 mg | ORAL_TABLET | Freq: Every day | ORAL | 1 refills | Status: DC
Start: 2021-11-19 — End: 2023-12-07
  Filled 2021-11-19: qty 30, 30d supply, fill #0
  Filled 2022-04-13 – 2022-06-18 (×2): qty 30, 30d supply, fill #1

## 2021-11-19 NOTE — Progress Notes (Signed)
Virtual Visit via Telephone Note  I connected with Joyce Ballard on 11/19/21 at  3:50 PM EST by telephone and verified that I am speaking with the correct person using two identifiers.  Location: Patient: home Provider: home office   I discussed the limitations, risks, security and privacy concerns of performing an evaluation and management service by telephone and the availability of in person appointments. I also discussed with the patient that there may be a patient responsible charge related to this service. The patient expressed understanding and agreed to proceed.   History of Present Illness:  Joyce Ballard was seen at the ED for bronchitis on 10/22/2022.  Her CXR was abnormal and they recommended she get a follow up CT for a 1.5 cm nodule.  She is feeling better.  She has no insurance and wants to apply for financial assistance and any assistance available in the pharmacy.  She takes HCTZ for htn.  Denies any other issues or concerns today.  Also has COPD and needs to be on spiriva daily but can't afford which led to recent ED visit   IMPRESSION from CXR: 1. 1.5 cm nodule projecting over the lingula/right middle lobe on the lateral view, not clearly seen on PA view or the prior study. Recommend noncontrast chest CT for further evaluation.   Observations/Objective:  NAD.  A&Ox3   Assessment and Plan: 1. Hypertension, unspecified type - hydrochlorothiazide (HYDRODIURIL) 25 MG tablet; Take 1 tablet (25 mg total) by mouth daily.  Dispense: 90 tablet; Refill: 1  2. Chronic obstructive pulmonary disease, unspecified COPD type (HCC) - Tiotropium Bromide Monohydrate (SPIRIVA RESPIMAT) 2.5 MCG/ACT AERS; Inhale 2 puffs into the lungs daily.  Dispense: 4 g; Refill: 2 - albuterol (VENTOLIN HFA) 108 (90 Base) MCG/ACT inhaler; Inhale 1-2 puffs into the lungs every 6 (six) hours as needed for wheezing or shortness of breath.  Dispense: 18 g; Refill: 2 - CT Chest Wo Contrast; Future  3. Abnormal  CXR - CT Chest Wo Contrast; Future  4. Smoker Smoking and dangers of nicotine have been discussed at length. Long term health consequences of smoking reviewed in detail.  Methods for helping with cessation have been reviewed.  Patient expresses understanding. - CT Chest Wo Contrast; Future  5. Solitary pulmonary nodule - CT Chest Wo Contrast; Future    Follow Up Instructions: Will have financial to discuss options with patient.  Assign PCP appt in 2 months.  Schedule CT(CMA)   I discussed the assessment and treatment plan with the patient. The patient was provided an opportunity to ask questions and all were answered. The patient agreed with the plan and demonstrated an understanding of the instructions.   The patient was advised to call back or seek an in-person evaluation if the symptoms worsen or if the condition fails to improve as anticipated.  I provided 17 minutes of non-face-to-face time during this encounter.   [place]ian Co, PA-C  Patient ID: Joyce Ballard, female   DOB: [birth year], 56 y.o.   MRN: [id number]

## 2021-11-28 ENCOUNTER — Other Ambulatory Visit: Payer: Self-pay

## 2021-11-28 ENCOUNTER — Ambulatory Visit (HOSPITAL_COMMUNITY)
Admission: RE | Admit: 2021-11-28 | Discharge: 2021-11-28 | Disposition: A | Discharge status: Home / Self Care | Payer: Self-pay | Source: Ambulatory Visit | Attending: Physician Assistant | Admitting: Physician Assistant

## 2021-11-28 DIAGNOSIS — J449 Chronic obstructive pulmonary disease, unspecified: Secondary | ICD-10-CM | POA: Insufficient documentation

## 2021-11-28 DIAGNOSIS — R9389 Abnormal findings on diagnostic imaging of other specified body structures: Secondary | ICD-10-CM | POA: Insufficient documentation

## 2021-11-28 DIAGNOSIS — F17200 Nicotine dependence, unspecified, uncomplicated: Secondary | ICD-10-CM | POA: Insufficient documentation

## 2021-11-28 DIAGNOSIS — R911 Solitary pulmonary nodule: Secondary | ICD-10-CM | POA: Insufficient documentation

## 2021-12-01 ENCOUNTER — Other Ambulatory Visit: Payer: Self-pay

## 2021-12-01 ENCOUNTER — Ambulatory Visit (INDEPENDENT_AMBULATORY_CARE_PROVIDER_SITE_OTHER): Payer: Self-pay | Admitting: Family Medicine

## 2021-12-01 ENCOUNTER — Encounter: Payer: Self-pay | Admitting: Family Medicine

## 2021-12-01 VITALS — BP 128/85 | HR 72 | Temp 98.1°F | Resp 16 | Wt 157.0 lb

## 2021-12-01 DIAGNOSIS — F32A Depression, unspecified: Secondary | ICD-10-CM

## 2021-12-01 DIAGNOSIS — Z7689 Persons encountering health services in other specified circumstances: Secondary | ICD-10-CM

## 2021-12-01 DIAGNOSIS — I10 Essential (primary) hypertension: Secondary | ICD-10-CM

## 2021-12-01 DIAGNOSIS — F17200 Nicotine dependence, unspecified, uncomplicated: Secondary | ICD-10-CM

## 2021-12-01 MED ORDER — PAROXETINE HCL 20 MG PO TABS
20.0000 mg | ORAL_TABLET | Freq: Every day | ORAL | 1 refills | Status: DC
  Filled 2021-12-01: qty 30, 30d supply, fill #0
  Filled 2022-04-13 – 2022-06-18 (×2): qty 30, 30d supply, fill #1

## 2021-12-01 NOTE — Progress Notes (Signed)
Patient is very depressed.Patient is here to establish care with new provider.  Patient is very anxious about her lung results and is awaiting for someone to talk  with her

## 2021-12-02 ENCOUNTER — Other Ambulatory Visit: Payer: Self-pay

## 2021-12-02 NOTE — Progress Notes (Signed)
New Patient Office Visit  Subjective:  Patient ID: Joyce Ballard, female    DOB: Nov 05, 1965  Age: 56 y.o. MRN: [id number]  CC:  Chief Complaint  Patient presents with   Establish Care    HPI Joyce Ballard presents for to establish care and for follow up of hypertension. Patient also reports that she has had increased social stressors and that she would like something to help her mood.   Past Medical History:  Diagnosis Date   Anxiety    COPD (chronic obstructive pulmonary disease) (HCC)    Depression    Hypertension    PTSD (post-traumatic stress disorder)     Past Surgical History:  Procedure Laterality Date   TUBAL LIGATION      Family History  Problem Relation Age of Onset   Hypertension Mother    Cancer Mother    Depression Mother    Post-traumatic stress disorder Mother     Social History   Socioeconomic History   Marital status: Single    Spouse name: Not on file   Number of children: Not on file   Years of education: Not on file   Highest education level: Not on file  Occupational History   Not on file  Tobacco Use   Smoking status: Every Day    Packs/day: 1.00    Types: Cigarettes   Smokeless tobacco: Never  Vaping Use   Vaping Use: Never used  Substance and Sexual Activity   Alcohol use: Yes   Drug use: Yes    Types: Marijuana   Sexual activity: Not on file  Other Topics Concern   Not on file  Social History Narrative   Not on file   Social Determinants of Health   Financial Resource Strain: Not on file  Food Insecurity: Not on file  Transportation Needs: Not on file  Physical Activity: Not on file  Stress: Not on file  Social Connections: Not on file  Intimate Partner Violence: Not on file    ROS Review of Systems  Psychiatric/Behavioral:  Positive for sleep disturbance. Negative for self-injury and suicidal ideas. The patient is nervous/anxious.   All other systems reviewed and are negative.  Objective:   Today's Vitals: BP  128/85    Pulse 72    Temp 98.1 F (36.7 C) (Oral)    Resp 16    Wt 157 lb (71.2 kg)    SpO2 97%    BMI 26.13 kg/m   Physical Exam Vitals and nursing note reviewed.    Assessment & Plan:   1. Depression, unspecified depression type Patient started on Paxil 20 mg daily. Will monitor. Also will refer to [facility] for counseling.   2. Essential hypertension Appears stable with present management. Continue and monitor  3. Smoking Discussed reduction/cessation  4. Encounter to establish care   Outpatient Encounter Medications as of 12/01/2021  Medication Sig   albuterol (VENTOLIN HFA) 108 (90 Base) MCG/ACT inhaler Inhale 1-2 puffs into the lungs every 6 (six) hours as needed for wheezing or shortness of breath.   hydrochlorothiazide (HYDRODIURIL) 25 MG tablet Take 1 tablet (25 mg total) by mouth daily.   PARoxetine (PAXIL) 20 MG tablet Take 1 tablet (20 mg total) by mouth daily.   Tiotropium Bromide Monohydrate (SPIRIVA RESPIMAT) 2.5 MCG/ACT AERS Inhale 2 puffs into the lungs daily.   No facility-administered encounter medications on file as of 12/01/2021.    Follow-up: Return in about 4 weeks (around 12/29/2021) for follow up.   Georganna Skeans  P, MD

## 2022-01-14 IMAGING — CR DG CHEST 2V
2 series · 2 of 2 positions shown · non-contrast
Comparison: None.

CLINICAL DATA: Chest pain.

EXAM:
CHEST - 2 VIEW

[w chest pa]
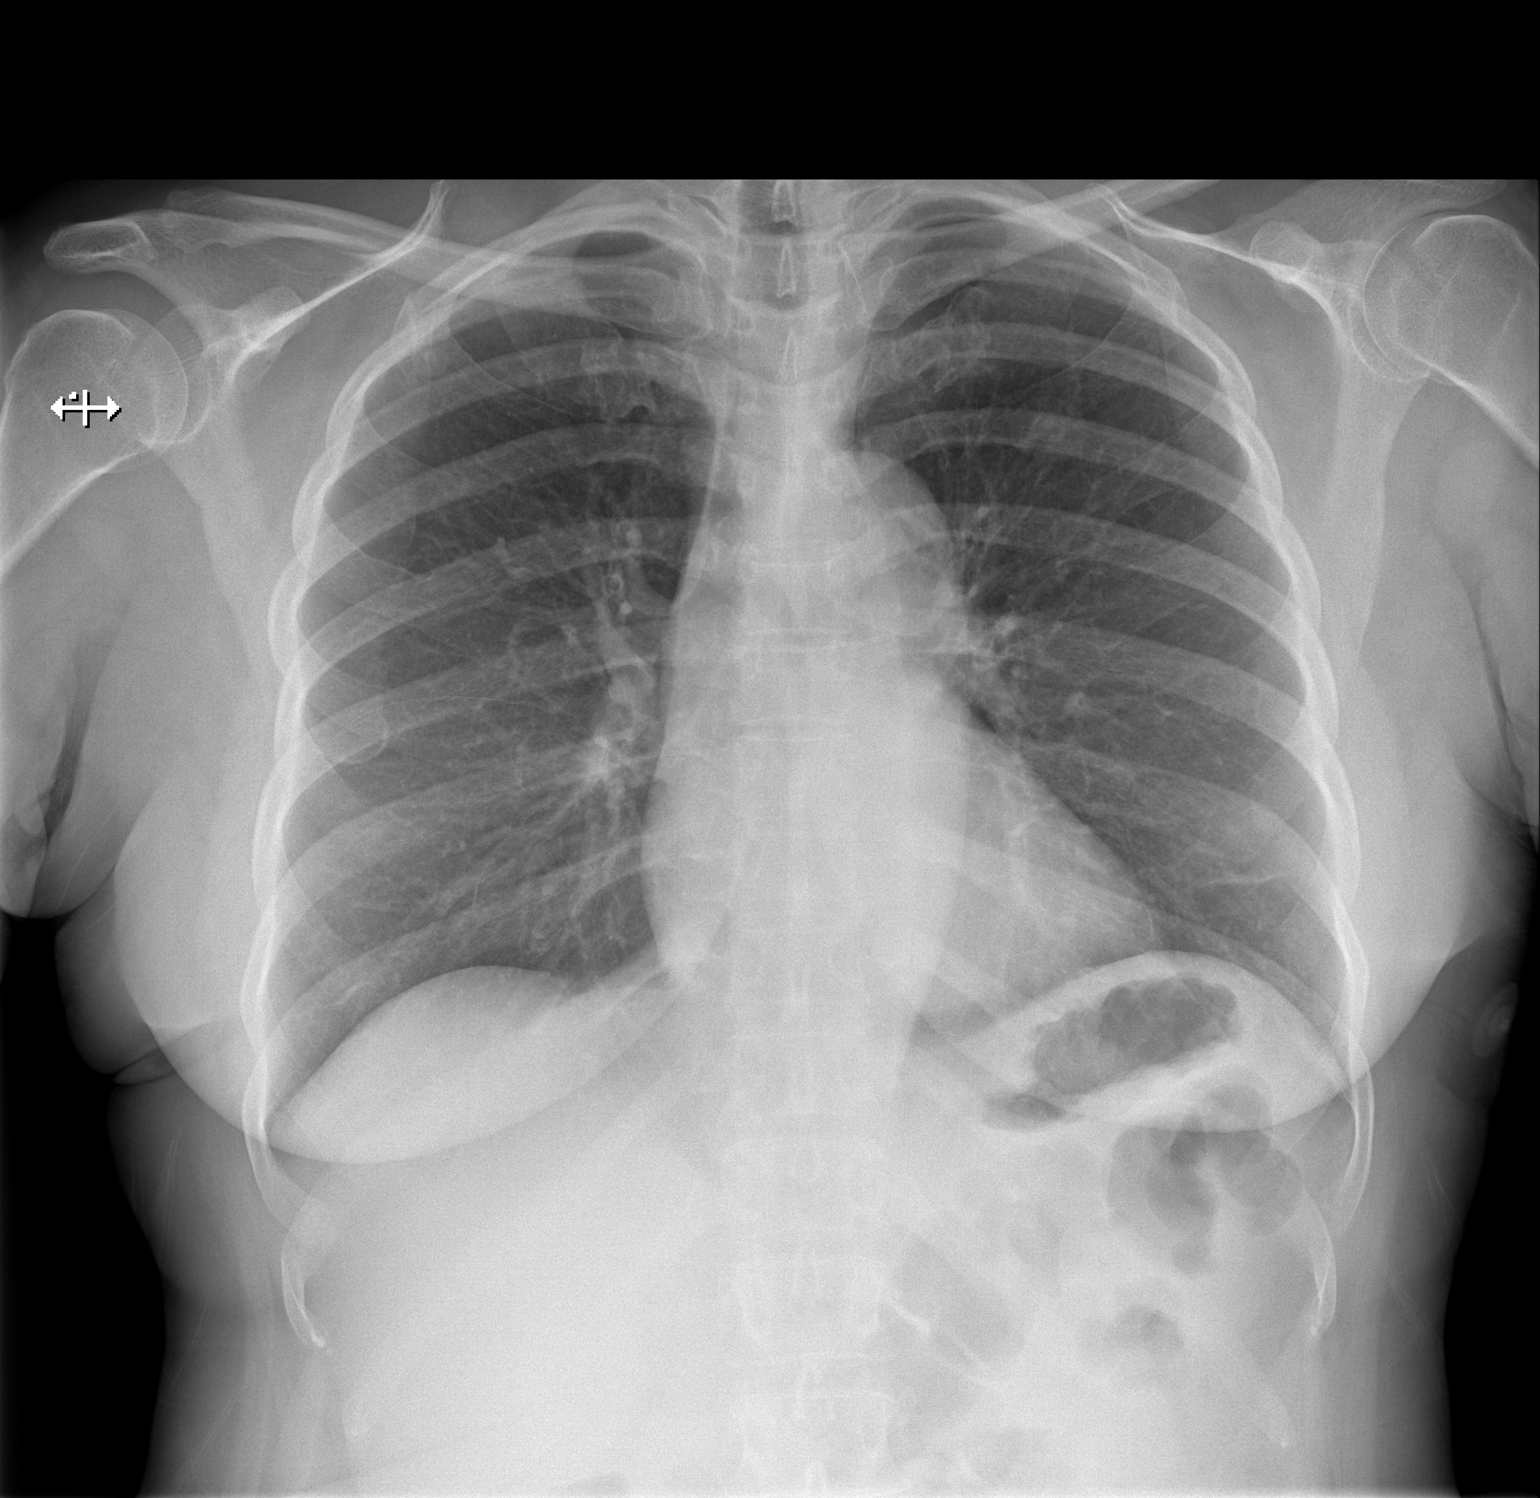

[w chest lat]
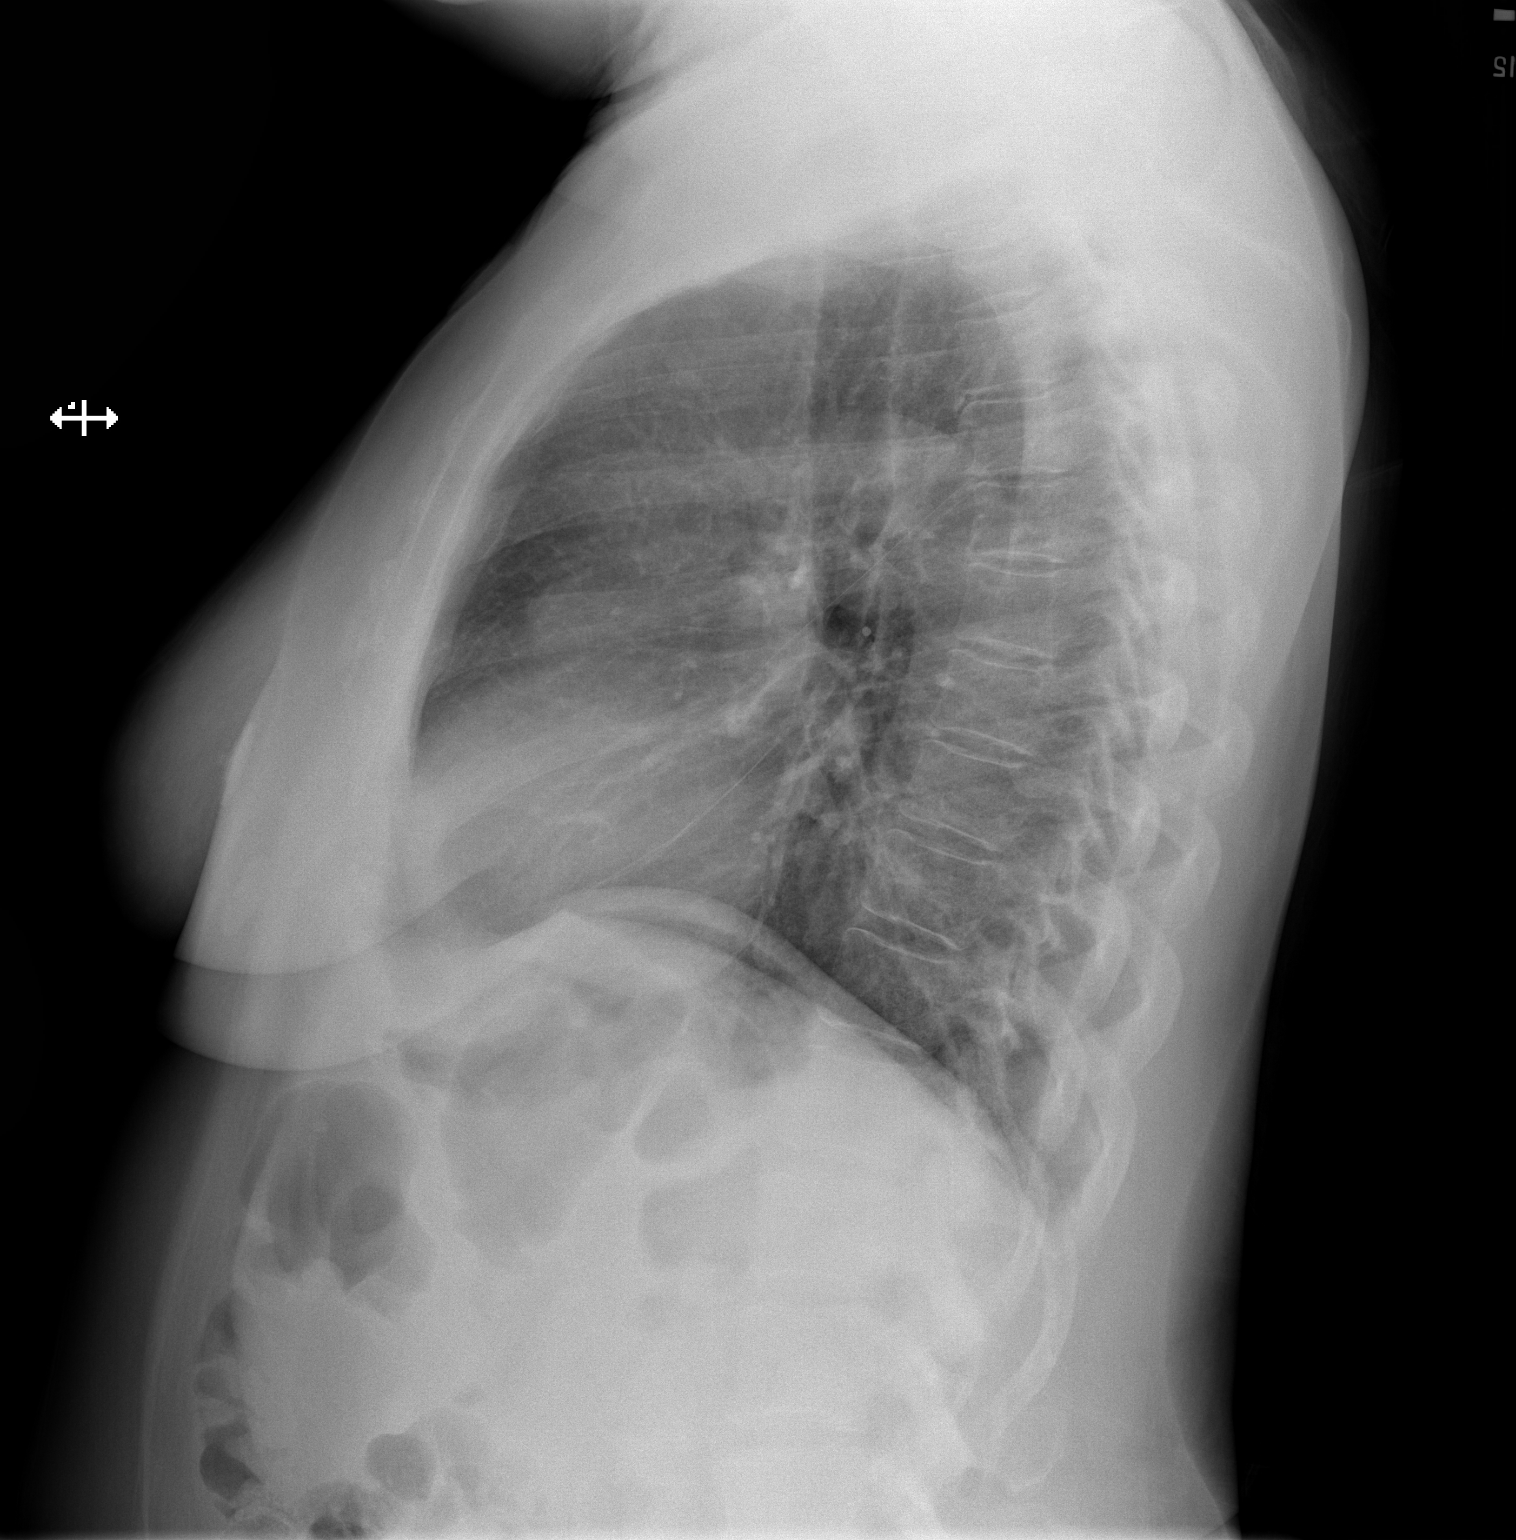

[2 of 2 positions shown; findings below may reference images not displayed]

FINDINGS: The heart size and mediastinal contours are within normal limits.
Both lungs are clear. No visible pleural effusions or pneumothorax.
No acute osseous abnormality.
IMPRESSION: No active cardiopulmonary disease.

## 2022-04-13 ENCOUNTER — Other Ambulatory Visit: Payer: Self-pay | Admitting: Pharmacist

## 2022-04-13 ENCOUNTER — Other Ambulatory Visit: Payer: Self-pay

## 2022-04-13 MED ORDER — ALBUTEROL SULFATE HFA 108 (90 BASE) MCG/ACT IN AERS
1.0000 | INHALATION_SPRAY | Freq: Four times a day (QID) | RESPIRATORY_TRACT | 2 refills | Status: AC | PRN
Start: 2022-04-13 — End: ?
  Filled 2022-04-13: qty 8.5, 25d supply, fill #0
  Filled 2022-06-18 – 2022-12-18 (×3): qty 6.7, 25d supply, fill #0
  Filled 2022-12-18: qty 8.5, 25d supply, fill #1

## 2022-04-20 ENCOUNTER — Other Ambulatory Visit: Payer: Self-pay

## 2022-06-18 ENCOUNTER — Other Ambulatory Visit: Payer: Self-pay | Admitting: Pharmacist

## 2022-06-18 ENCOUNTER — Other Ambulatory Visit: Payer: Self-pay

## 2022-06-18 MED ORDER — INCRUSE ELLIPTA 62.5 MCG/ACT IN AEPB
1.0000 | INHALATION_SPRAY | Freq: Every day | RESPIRATORY_TRACT | 0 refills | Status: AC
  Filled 2022-06-18: qty 30, 30d supply, fill #0

## 2022-06-30 ENCOUNTER — Emergency Department (HOSPITAL_COMMUNITY)
Admission: EM | Admit: 2022-06-30 | Discharge: 2022-06-30 | Disposition: A | Discharge status: Home / Self Care | Payer: Commercial Managed Care - HMO | Attending: Emergency Medicine | Admitting: Emergency Medicine

## 2022-06-30 ENCOUNTER — Other Ambulatory Visit: Payer: Self-pay

## 2022-06-30 ENCOUNTER — Emergency Department (HOSPITAL_COMMUNITY): Payer: Commercial Managed Care - HMO

## 2022-06-30 ENCOUNTER — Encounter (HOSPITAL_COMMUNITY): Payer: Self-pay

## 2022-06-30 DIAGNOSIS — R058 Other specified cough: Secondary | ICD-10-CM

## 2022-06-30 DIAGNOSIS — R062 Wheezing: Secondary | ICD-10-CM

## 2022-06-30 DIAGNOSIS — R531 Weakness: Secondary | ICD-10-CM | POA: Insufficient documentation

## 2022-06-30 DIAGNOSIS — Z20822 Contact with and (suspected) exposure to covid-19: Secondary | ICD-10-CM | POA: Insufficient documentation

## 2022-06-30 DIAGNOSIS — Z79899 Other long term (current) drug therapy: Secondary | ICD-10-CM | POA: Diagnosis not present

## 2022-06-30 DIAGNOSIS — J449 Chronic obstructive pulmonary disease, unspecified: Secondary | ICD-10-CM | POA: Diagnosis not present

## 2022-06-30 DIAGNOSIS — Z7951 Long term (current) use of inhaled steroids: Secondary | ICD-10-CM | POA: Insufficient documentation

## 2022-06-30 DIAGNOSIS — R0602 Shortness of breath: Secondary | ICD-10-CM | POA: Diagnosis present

## 2022-06-30 DIAGNOSIS — I10 Essential (primary) hypertension: Secondary | ICD-10-CM | POA: Insufficient documentation

## 2022-06-30 DIAGNOSIS — R059 Cough, unspecified: Secondary | ICD-10-CM | POA: Diagnosis not present

## 2022-06-30 LAB — CBC WITH DIFFERENTIAL/PLATELET
Abs Immature Granulocytes: 0.02 10*3/uL (ref 0.00–0.07)
Basophils Absolute: 0 10*3/uL (ref 0.0–0.1)
Basophils Relative: 0 %
Eosinophils Absolute: 0 10*3/uL (ref 0.0–0.5)
Eosinophils Relative: 0 %
HCT: 42.7 % (ref 36.0–46.0)
Hemoglobin: 14.6 g/dL (ref 12.0–15.0)
Immature Granulocytes: 0 %
Lymphocytes Relative: 25 %
Lymphs Abs: 2.5 10*3/uL (ref 0.7–4.0)
MCH: 31.9 pg (ref 26.0–34.0)
MCHC: 34.2 g/dL (ref 30.0–36.0)
MCV: 93.2 fL (ref 80.0–100.0)
Monocytes Absolute: 0.8 10*3/uL (ref 0.1–1.0)
Monocytes Relative: 8 %
Neutro Abs: 6.7 10*3/uL (ref 1.7–7.7)
Neutrophils Relative %: 67 %
Platelets: 428 10*3/uL — ABNORMAL HIGH (ref 150–400)
RBC: 4.58 MIL/uL (ref 3.87–5.11)
RDW: 12.5 % (ref 11.5–15.5)
WBC: 10.1 10*3/uL (ref 4.0–10.5)
nRBC: 0 % (ref 0.0–0.2)

## 2022-06-30 LAB — BASIC METABOLIC PANEL
Anion gap: 14 (ref 5–15)
BUN: 21 mg/dL — ABNORMAL HIGH (ref 6–20)
CO2: 27 mmol/L (ref 22–32)
Calcium: 10.5 mg/dL — ABNORMAL HIGH (ref 8.9–10.3)
Chloride: 94 mmol/L — ABNORMAL LOW (ref 98–111)
Creatinine, Ser: 1.34 mg/dL — ABNORMAL HIGH (ref 0.44–1.00)
GFR, Estimated: 46 mL/min — ABNORMAL LOW (ref >60)
Glucose, Bld: 120 mg/dL — ABNORMAL HIGH (ref 70–99)
Potassium: 3.5 mmol/L (ref 3.5–5.1)
Sodium: 135 mmol/L (ref 135–145)

## 2022-06-30 LAB — TROPONIN I (HIGH SENSITIVITY): Troponin I (High Sensitivity): 8 ng/L (ref <18)

## 2022-06-30 LAB — SARS CORONAVIRUS 2 BY RT PCR: SARS Coronavirus 2 by RT PCR: NEGATIVE

## 2022-06-30 LAB — MAGNESIUM: Magnesium: 2.1 mg/dL (ref 1.7–2.4)

## 2022-06-30 MED ORDER — ACETAMINOPHEN 500 MG PO TABS
1000.0000 mg | ORAL_TABLET | Freq: Once | ORAL | Status: AC
  Administered 2022-06-30: 1000 mg via ORAL
  Filled 2022-06-30: qty 2

## 2022-06-30 MED ORDER — ALBUTEROL SULFATE (2.5 MG/3ML) 0.083% IN NEBU
5.0000 mg | INHALATION_SOLUTION | Freq: Once | RESPIRATORY_TRACT | Status: AC
  Administered 2022-06-30: 5 mg via RESPIRATORY_TRACT
  Filled 2022-06-30: qty 6

## 2022-06-30 NOTE — Discharge Instructions (Addendum)
It was our pleasure to provide your ER care today - we hope that you feel better.  Drink plenty of fluids/stay well hydrated.   Your blood pressure is high, and kidney function test mildly high  - stay well hydrated/drink adequate water, continue meds, limit salt intake, eat heart health meal plan, and follow up closely with primary care doctor in the coming week.     Use inhaler as need, avoid any smoking.   Return to ER if worse, new symptoms, chest pain, trouble breathing, high fevers, weak/fainting, or other concern.

## 2022-06-30 NOTE — ED Provider Notes (Signed)
[facility] EMERGENCY DEPARTMENT Provider Note   CSN: [id number] Arrival date & time: 06/30/22  1540     History  Chief Complaint  Patient presents with   Weakness   Cough   Nausea    Hollynn Garno is a 56 y.o. female.  Patient with hx 'copd' c/o of recent exposure to someone with covid. States had non prod cough, and then states had episode of feeling generally weak, tired. Denies sore throat. No headache. No chest pain or chest discomfort. No abd pain. No dysuria or gu c/o. States felt wheezy earlier, used inhaler. States compliant w normal meds including bp meds. No leg pain or swelling. No fever or chills. Symptoms at rest, not related to activity or exertion.   The history is provided by the patient and medical records.  Shortness of Breath Associated symptoms: cough and wheezing   Associated symptoms: no abdominal pain, no chest pain, no fever, no headaches, no neck pain, no rash and no sore throat        Home Medications Prior to Admission medications   Medication Sig Start Date End Date Taking? Authorizing Provider  albuterol (PROAIR HFA) 108 (90 Base) MCG/ACT inhaler Inhale 1-2 puffs into the lungs every 6 (six) hours as needed for wheezing or shortness of breath. 04/13/22   Hoy Register, MD  hydrochlorothiazide (HYDRODIURIL) 25 MG tablet Take 1 tablet (25 mg total) by mouth daily. 11/19/21   Anders Simmonds, PA-C  PARoxetine (PAXIL) 20 MG tablet Take 1 tablet (20 mg total) by mouth daily. 12/01/21   Georganna Skeans, MD  umeclidinium bromide (INCRUSE ELLIPTA) 62.5 MCG/ACT AEPB Inhale 1 puff into the lungs daily. 06/18/22   Hoy Register, MD      Allergies    Patient has no known allergies.    Review of Systems   Review of Systems  Constitutional:  Negative for chills and fever.  HENT:  Negative for sore throat.   Eyes:  Negative for redness.  Respiratory:  Positive for cough and wheezing. Negative for shortness of breath.   Cardiovascular:   Negative for chest pain, palpitations and leg swelling.  Gastrointestinal:  Negative for abdominal pain.  Genitourinary:  Negative for dysuria and flank pain.  Musculoskeletal:  Negative for back pain and neck pain.  Skin:  Negative for rash.  Neurological:  Negative for headaches.  Hematological:  Does not bruise/bleed easily.  Psychiatric/Behavioral:  Negative for confusion.     Physical Exam Updated Vital Signs BP (!) 147/103   Pulse 75   Temp 99 F (37.2 C) (Oral)   Resp 16   Ht 1.651 m (5\' 5" )   Wt 65.8 kg   SpO2 100%   BMI 24.13 kg/m  Physical Exam Vitals and nursing note reviewed.  Constitutional:      Appearance: Normal appearance. She is well-developed.  HENT:     Head: Atraumatic.     Nose: Nose normal.     Mouth/Throat:     Mouth: Mucous membranes are moist.     Pharynx: Oropharynx is clear.  Eyes:     General: No scleral icterus.    Conjunctiva/sclera: Conjunctivae normal.     Pupils: Pupils are equal, round, and reactive to light.  Neck:     Trachea: No tracheal deviation.     Comments: No stiffness or rigidity.  Cardiovascular:     Rate and Rhythm: Normal rate and regular rhythm.     Pulses: Normal pulses.     Heart sounds:  Normal heart sounds. No murmur heard.    No friction rub. No gallop.  Pulmonary:     Effort: Pulmonary effort is normal. No respiratory distress.     Comments: Mild wheezing.  Abdominal:     General: Bowel sounds are normal. There is no distension.     Palpations: Abdomen is soft.     Tenderness: There is no abdominal tenderness.  Genitourinary:    Comments: No cva tenderness.  Musculoskeletal:        General: No swelling or tenderness.     Cervical back: Normal range of motion and neck supple. No rigidity. No muscular tenderness.     Right lower leg: No edema.     Left lower leg: No edema.  Skin:    General: Skin is warm and dry.     Findings: No rash.  Neurological:     Mental Status: She is alert.     Comments: Alert,  speech normal. Motor/sens grossly intact bil. Steady gait.   Psychiatric:        Mood and Affect: Mood normal.     ED Results / Procedures / Treatments   Labs (all labs ordered are listed, but only abnormal results are displayed) Results for orders placed or performed during the hospital encounter of 06/30/22  SARS Coronavirus 2 by RT PCR (hospital order, performed in [facility] hospital lab) *cepheid single result test* Anterior Nasal Swab   Specimen: Anterior Nasal Swab  Result Value Ref Range   SARS Coronavirus 2 by RT PCR NEGATIVE NEGATIVE  CBC with Differential  Result Value Ref Range   WBC 10.1 4.0 - 10.5 K/uL   RBC 4.58 3.87 - 5.11 MIL/uL   Hemoglobin 14.6 12.0 - 15.0 g/dL   HCT 62.9 52.8 - 41.3 %   MCV 93.2 80.0 - 100.0 fL   MCH 31.9 26.0 - 34.0 pg   MCHC 34.2 30.0 - 36.0 g/dL   RDW 24.4 01.0 - 27.2 %   Platelets 428 (H) 150 - 400 K/uL   nRBC 0.0 0.0 - 0.2 %   Neutrophils Relative % 67 %   Neutro Abs 6.7 1.7 - 7.7 K/uL   Lymphocytes Relative 25 %   Lymphs Abs 2.5 0.7 - 4.0 K/uL   Monocytes Relative 8 %   Monocytes Absolute 0.8 0.1 - 1.0 K/uL   Eosinophils Relative 0 %   Eosinophils Absolute 0.0 0.0 - 0.5 K/uL   Basophils Relative 0 %   Basophils Absolute 0.0 0.0 - 0.1 K/uL   Immature Granulocytes 0 %   Abs Immature Granulocytes 0.02 0.00 - 0.07 K/uL  Basic metabolic panel  Result Value Ref Range   Sodium 135 135 - 145 mmol/L   Potassium 3.5 3.5 - 5.1 mmol/L   Chloride 94 (L) 98 - 111 mmol/L   CO2 27 22 - 32 mmol/L   Glucose, Bld 120 (H) 70 - 99 mg/dL   BUN 21 (H) 6 - 20 mg/dL   Creatinine, Ser 5.36 (H) 0.44 - 1.00 mg/dL   Calcium 64.4 (H) 8.9 - 10.3 mg/dL   GFR, Estimated 46 (L) >60 mL/min   Anion gap 14 5 - 15  Magnesium  Result Value Ref Range   Magnesium 2.1 1.7 - 2.4 mg/dL  Troponin I (High Sensitivity)  Result Value Ref Range   Troponin I (High Sensitivity) 8 <18 ng/L   DG Chest 2 View  Result Date: 06/30/2022 CLINICAL DATA:  History of COPD,  shortness of breath and cough. EXAM:  CHEST - 2 VIEW COMPARISON:  Chest radiograph October 22, 2021 and CT November 28, 2021. FINDINGS: The heart size and mediastinal contours are within normal limits. Aortic atherosclerosis. No focal airspace consolidation. No pleural effusion. No pneumothorax. No acute osseous abnormality. IMPRESSION: No acute cardiopulmonary disease. Electronically Signed   By: Maudry Mayhew M.D.   On: 06/30/2022 18:13     EKG EKG Interpretation  Date/Time:  Tuesday June 30 2022 16:36:20 EDT Ventricular Rate:  82 PR Interval:  138 QRS Duration: 80 QT Interval:  370 QTC Calculation: 432 R Axis:   73 Text Interpretation: Normal sinus rhythm with sinus arrhythmia Nonspecific ST abnormality No significant change since last tracing Confirmed by Cathren Laine ([id number]) on 06/30/2022 9:44:15 PM  Radiology DG Chest 2 View  Result Date: 06/30/2022 CLINICAL DATA:  History of COPD, shortness of breath and cough. EXAM: CHEST - 2 VIEW COMPARISON:  Chest radiograph October 22, 2021 and CT November 28, 2021. FINDINGS: The heart size and mediastinal contours are within normal limits. Aortic atherosclerosis. No focal airspace consolidation. No pleural effusion. No pneumothorax. No acute osseous abnormality. IMPRESSION: No acute cardiopulmonary disease. Electronically Signed   By: Maudry Mayhew M.D.   On: 06/30/2022 18:13    Procedures Procedures    Medications Ordered in ED Medications  albuterol (PROVENTIL) (2.5 MG/3ML) 0.083% nebulizer solution 5 mg (5 mg Nebulization Given 06/30/22 2226)  acetaminophen (TYLENOL) tablet 1,000 mg (1,000 mg Oral Given 06/30/22 2225)    ED Course/ Medical Decision Making/ A&P                           Medical Decision Making Problems Addressed: Essential hypertension: chronic illness or injury with exacerbation, progression, or side effects of treatment that poses a threat to life or bodily functions Generalized weakness: acute illness or injury  with systemic symptoms Non-productive cough: acute illness or injury Wheezing: acute illness or injury  Amount and/or Complexity of Data Reviewed External Data Reviewed: notes. Labs: ordered. Decision-making details documented in ED Course. Radiology: ordered and independent interpretation performed. Decision-making details documented in ED Course. ECG/medicine tests: ordered and independent interpretation performed. Decision-making details documented in ED Course.  Risk OTC drugs. Prescription drug management. Decision regarding hospitalization.   Iv ns. Continuous pulse ox and cardiac monitoring. Labs ordered/sent. Imaging ordered.   Diff dx includes viral syndrome, pna, anemia, covid, etc - dispo decision including potential need for admission considered if pna or other acute process - will get labs and imaging and reassess.   Reviewed nursing notes and prior charts for additional history. External reports reviewed.    Cardiac monitor: sinus rhythm, rate 70.  V mild wheeze - albuterol tx.   Labs reviewed/interpreted by me - covid neg. Wbc and hgb normal. Trop normal  - after symptoms in past couple days, felt not c/w acs.  Cr sl increased - no recent to compare, po fluids/oral hydration.   Xrays reviewed/interpreted by me - no pna.   Overall pt appears well, not acutely sick, ill, in pain or with increased wob. Is tolerating po.   Pt appears stable for d/c.   Rec pcp f/u, incl for recent symptoms and elevated bp. Pt indicates has adequate meds at home.   Return precautions provided.            Final Clinical Impression(s) / ED Diagnoses Final diagnoses:  None    Rx / DC Orders ED Discharge Orders     None  Cathren Laine, MD 06/30/22 2241

## 2022-06-30 NOTE — ED Triage Notes (Signed)
Reports hx of COPD and had to use rescue inhaler.  Denies CP. Complain of back and thigh pain, reports knees are weak and she is lightheaded.  Exertional dsypnea.

## 2022-10-15 ENCOUNTER — Other Ambulatory Visit: Payer: Self-pay

## 2022-12-18 ENCOUNTER — Other Ambulatory Visit: Payer: Self-pay | Admitting: Physician Assistant

## 2022-12-18 ENCOUNTER — Ambulatory Visit
Admission: EM | Admit: 2022-12-18 | Discharge: 2022-12-18 | Discharge status: Against Medical Advice | Payer: Commercial Managed Care - HMO

## 2022-12-18 ENCOUNTER — Other Ambulatory Visit: Payer: Self-pay | Admitting: Family Medicine

## 2022-12-18 ENCOUNTER — Other Ambulatory Visit (HOSPITAL_COMMUNITY): Payer: Self-pay

## 2022-12-18 DIAGNOSIS — J449 Chronic obstructive pulmonary disease, unspecified: Secondary | ICD-10-CM

## 2022-12-18 DIAGNOSIS — I10 Essential (primary) hypertension: Secondary | ICD-10-CM

## 2022-12-18 NOTE — Telephone Encounter (Signed)
Requested medication (s) are due for refill today - yes  Requested medication (s) are on the active medication list -yes  Future visit scheduled -no  Last refill: 11/19/21  Notes to clinic: expired Rx, attempted to contact patient to schedule appointment- no answer and voicemail not set up  Requested Prescriptions  Pending Prescriptions Disp Refills   hydrochlorothiazide (HYDRODIURIL) 25 MG tablet 90 tablet 1    Sig: Take 1 tablet (25 mg total) by mouth daily.     Cardiovascular: Diuretics - Thiazide Failed - 12/18/2022  9:47 AM      Failed - Cr in normal range and within 180 days    Creatinine, Ser  Date Value Ref Range Status  06/30/2022 1.34 (H) 0.44 - 1.00 mg/dL Final         Failed - Last BP in normal range    BP Readings from Last 1 Encounters:  06/30/22 (!) 142/31         Failed - Valid encounter within last 6 months    Recent Outpatient Visits           1 year ago Depression, unspecified depression type   [facility] [facility], [place]   1 year ago Hypertension, unspecified type   [place], [place]              Passed - K in normal range and within 180 days    Potassium  Date Value Ref Range Status  06/30/2022 3.5 3.5 - 5.1 mmol/L Final         Passed - Na in normal range and within 180 days    Sodium  Date Value Ref Range Status  06/30/2022 135 135 - 145 mmol/L Final            Requested Prescriptions  Pending Prescriptions Disp Refills   hydrochlorothiazide (HYDRODIURIL) 25 MG tablet 90 tablet 1    Sig: Take 1 tablet (25 mg total) by mouth daily.     Cardiovascular: Diuretics - Thiazide Failed - 12/18/2022  9:47 AM      Failed - Cr in normal range and within 180 days    Creatinine, Ser  Date Value Ref Range Status  06/30/2022 1.34 (H) 0.44 - 1.00 mg/dL Final         Failed - Last BP in normal range    BP Readings from Last 1 Encounters:  06/30/22 (!)  142/31         Failed - Valid encounter within last 6 months    Recent Outpatient Visits           1 year ago Depression, unspecified depression type   [place] [facility], [place]   1 year ago Hypertension, unspecified type   [place], [place]              Passed - K in normal range and within 180 days    Potassium  Date Value Ref Range Status  06/30/2022 3.5 3.5 - 5.1 mmol/L Final         Passed - Na in normal range and within 180 days    Sodium  Date Value Ref Range Status  06/30/2022 135 135 - 145 mmol/L Final

## 2022-12-18 NOTE — Telephone Encounter (Signed)
Requested medication (s) are due for refill today - yes  Requested medication (s) are on the active medication list -yes  Future visit scheduled -no  Last refill: 06/18/22 #30  Notes to clinic: fails visit protocol, off protocol- requires provider review   Requested Prescriptions  Pending Prescriptions Disp Refills   umeclidinium bromide (INCRUSE ELLIPTA) 62.5 MCG/ACT AEPB 30 each 0    Sig: Inhale 1 puff into the lungs daily.     Off-Protocol Failed - 12/18/2022  9:47 AM      Failed - Medication not assigned to a protocol, review manually.      Failed - Valid encounter within last 12 months    Recent Outpatient Visits           1 year ago Depression, unspecified depression type   [place] [facility], [place]   1 year ago Hypertension, unspecified type   [place], [place]                 Requested Prescriptions  Pending Prescriptions Disp Refills   umeclidinium bromide (INCRUSE ELLIPTA) 62.5 MCG/ACT AEPB 30 each 0    Sig: Inhale 1 puff into the lungs daily.     Off-Protocol Failed - 12/18/2022  9:47 AM      Failed - Medication not assigned to a protocol, review manually.      Failed - Valid encounter within last 12 months    Recent Outpatient Visits           1 year ago Depression, unspecified depression type   [place] [facility], [place]   1 year ago Hypertension, unspecified type   [place], [place], [place]

## 2022-12-18 NOTE — Telephone Encounter (Signed)
Requested medication (s) are due for refill today - expired Rx  Requested medication (s) are on the active medication list -yes  Future visit scheduled -no  Last refill: 12/01/21 #30 1RF  Notes to clinic: expired Rx, fails visit protocol  Requested Prescriptions  Pending Prescriptions Disp Refills   PARoxetine (PAXIL) 20 MG tablet 30 tablet 1    Sig: Take 1 tablet (20 mg total) by mouth daily.     Psychiatry:  Antidepressants - SSRI Failed - 12/18/2022  9:47 AM      Failed - Valid encounter within last 6 months    Recent Outpatient Visits           1 year ago Depression, unspecified depression type   [place] [facility], [place]   1 year ago Hypertension, unspecified type   [place], [place]                 Requested Prescriptions  Pending Prescriptions Disp Refills   PARoxetine (PAXIL) 20 MG tablet 30 tablet 1    Sig: Take 1 tablet (20 mg total) by mouth daily.     Psychiatry:  Antidepressants - SSRI Failed - 12/18/2022  9:47 AM      Failed - Valid encounter within last 6 months    Recent Outpatient Visits           1 year ago Depression, unspecified depression type   [place] [facility], [place]   1 year ago Hypertension, unspecified type   [place], [place], [place]

## 2022-12-21 ENCOUNTER — Other Ambulatory Visit (HOSPITAL_COMMUNITY): Payer: Self-pay

## 2023-01-05 ENCOUNTER — Other Ambulatory Visit (HOSPITAL_COMMUNITY): Payer: Self-pay

## 2023-01-11 ENCOUNTER — Other Ambulatory Visit (HOSPITAL_COMMUNITY): Payer: Self-pay

## 2023-01-11 MED ORDER — HYDROCHLOROTHIAZIDE 25 MG PO TABS
25.0000 mg | ORAL_TABLET | Freq: Every day | ORAL | 1 refills | Status: DC
  Filled 2023-01-11: qty 90, 90d supply, fill #0

## 2023-01-11 MED ORDER — TRAZODONE HCL 50 MG PO TABS
50.0000 mg | ORAL_TABLET | Freq: Every day | ORAL | 0 refills | Status: DC
  Filled 2023-01-11: qty 30, 30d supply, fill #0

## 2023-01-11 MED ORDER — INCRUSE ELLIPTA 62.5 MCG/ACT IN AEPB
1.0000 | INHALATION_SPRAY | Freq: Every day | RESPIRATORY_TRACT | 1 refills | Status: AC
  Filled 2023-01-11: qty 30, 30d supply, fill #0

## 2023-01-11 MED ORDER — SERTRALINE HCL 50 MG PO TABS
ORAL_TABLET | ORAL | 3 refills | Status: DC
  Filled 2023-01-11: qty 30, 30d supply, fill #0

## 2023-01-12 ENCOUNTER — Other Ambulatory Visit (HOSPITAL_COMMUNITY): Payer: Self-pay

## 2023-01-13 ENCOUNTER — Other Ambulatory Visit (HOSPITAL_COMMUNITY): Payer: Self-pay

## 2023-03-10 IMAGING — CT CT CHEST W/O CM
2 of 4 series · 15 of 36 positions shown, 18 images · non-contrast
Comparison: Chest x-ray 10/22/2021

CLINICAL DATA: COPD.  Abnormal xray - lung nodule, >= 1 cm



[Series 2: thorax · axial · 0.76mm/px · z∈[-203,+39]mm · 12 of 143 slices shown, 15 images]
[im 11/143  mediastinal]
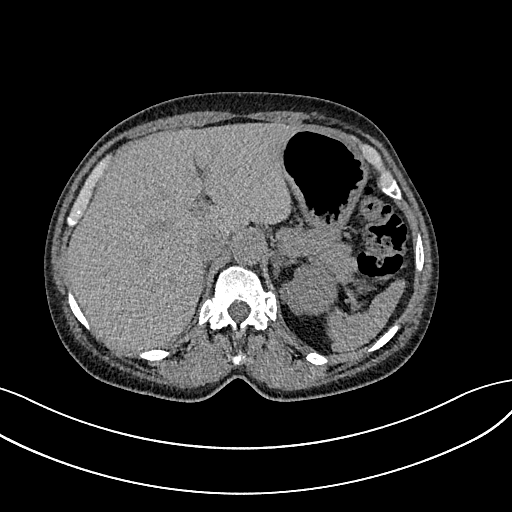
[im 11/143  lung]
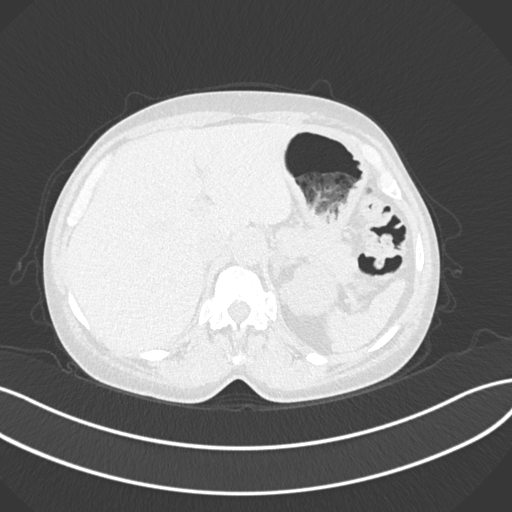
[im 22/143  lung]
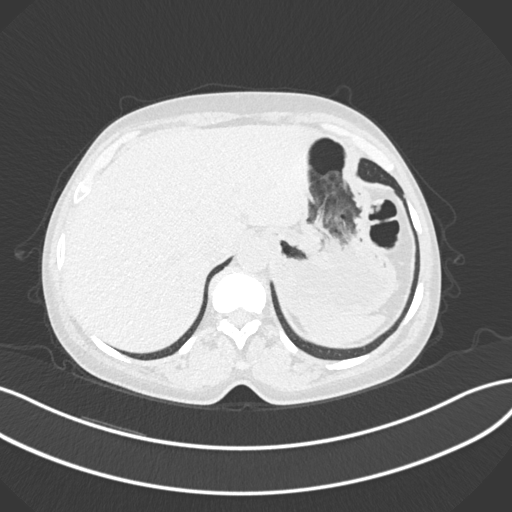
[im 33/143  lung]
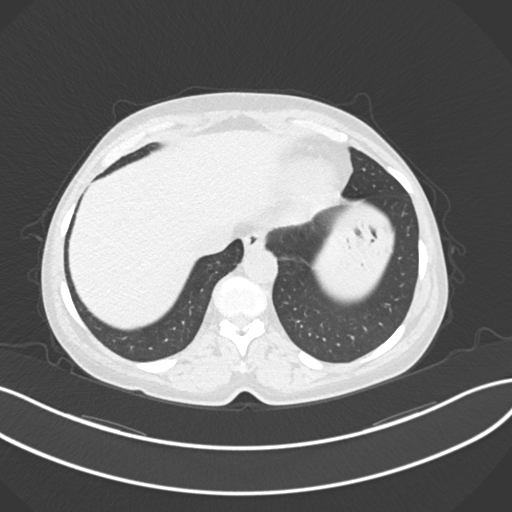
[im 44/143  lung]
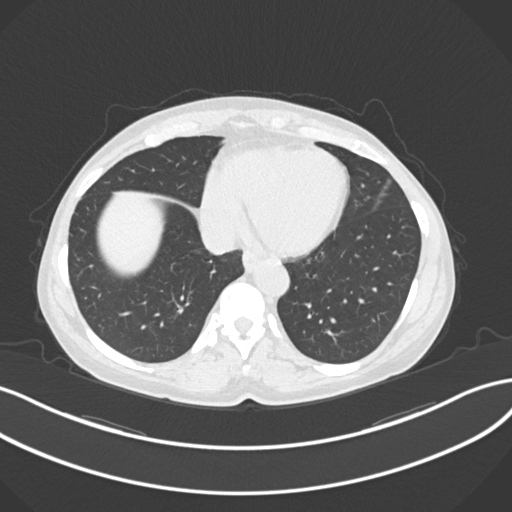
[im 55/143  mediastinal]
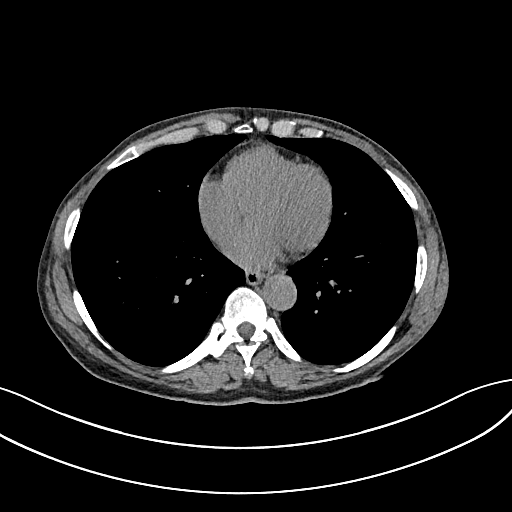
[im 55/143  lung]
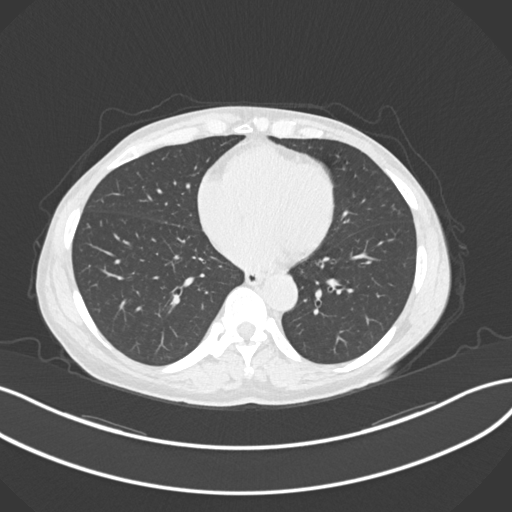
[im 66/143  lung]
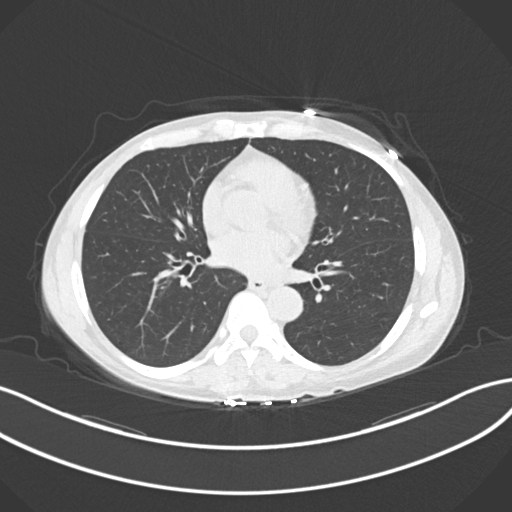
[im 77/143  lung]
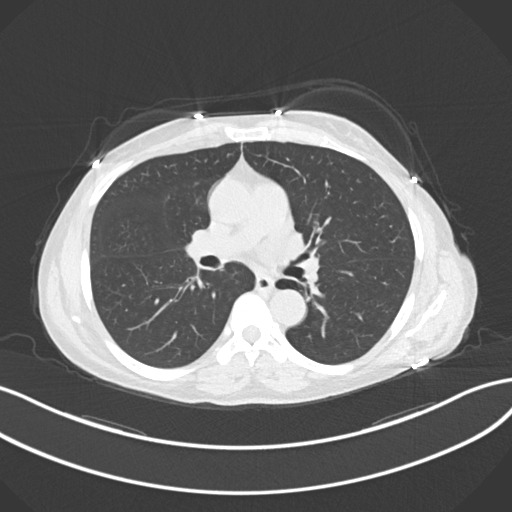
[im 88/143  lung]
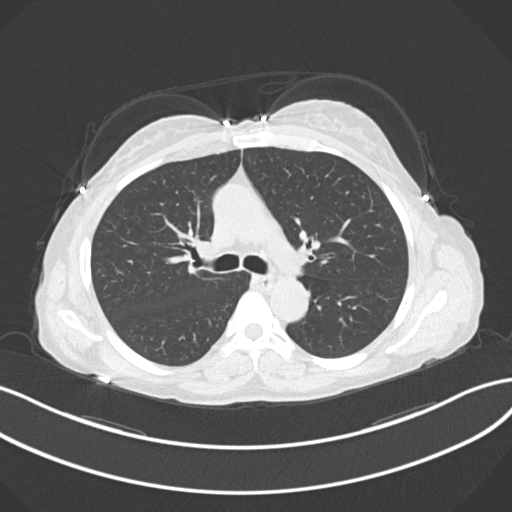
[im 99/143  mediastinal]
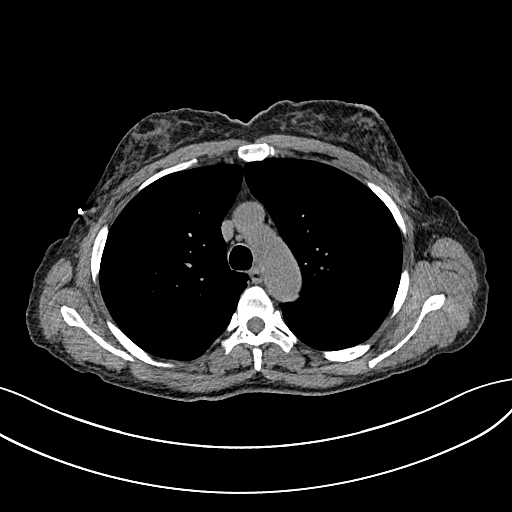
[im 99/143  lung]
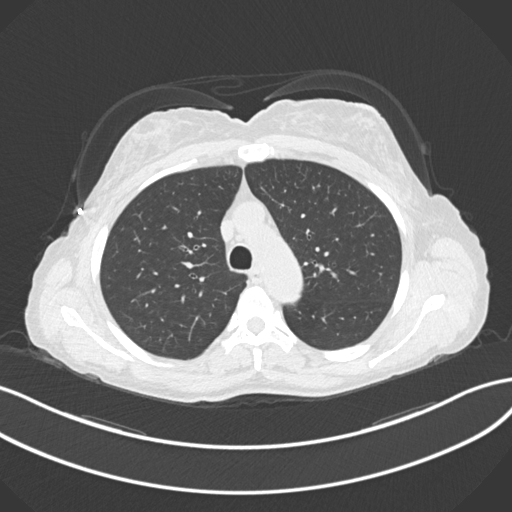
[im 110/143  lung]
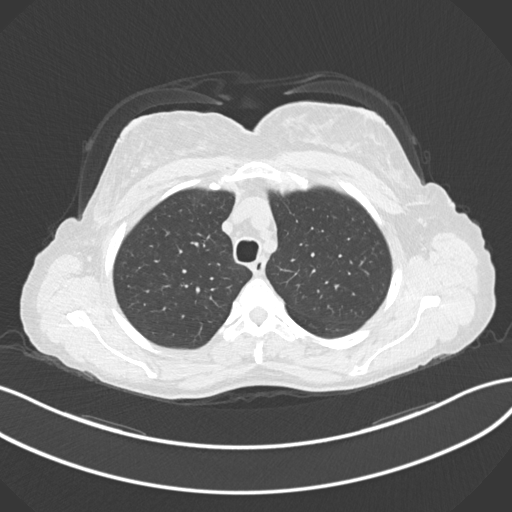
[im 121/143  lung]
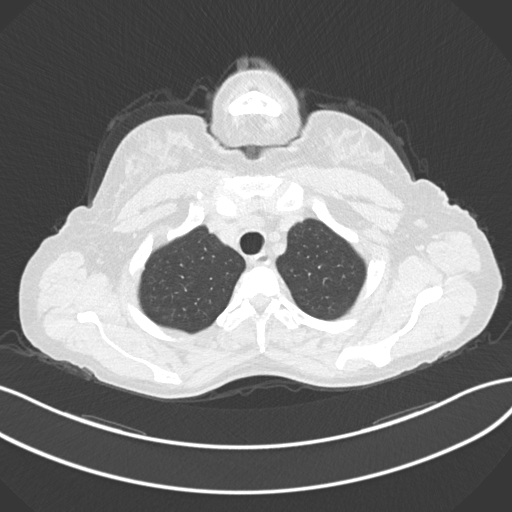
[im 132/143  lung]
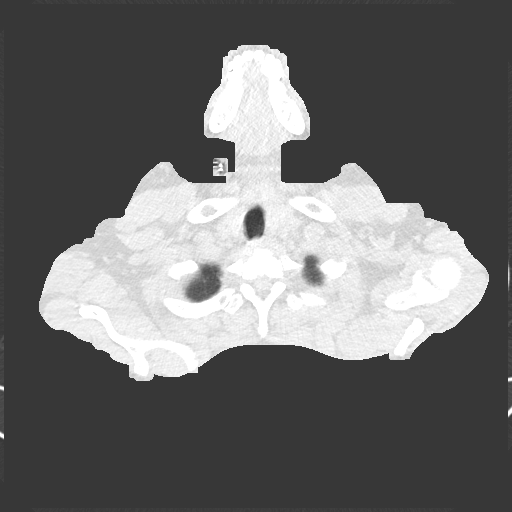

[Series 5: coronal · coronal · 0.59mm/px · 3 of 128 slices shown]
[im 26/128  lung]
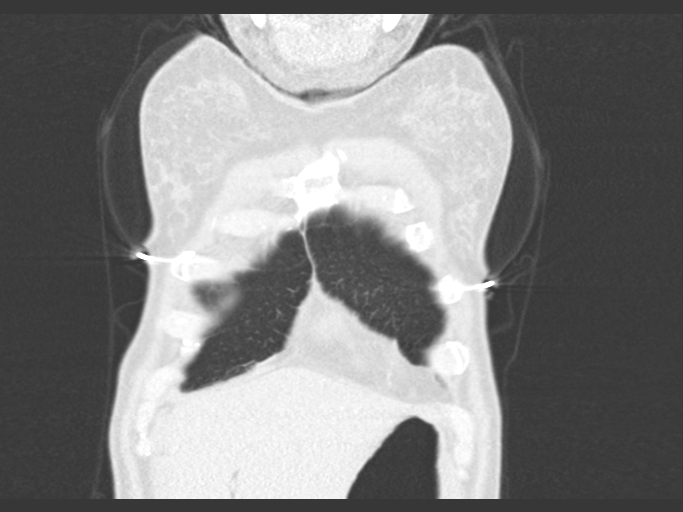
[im 51/128  lung]
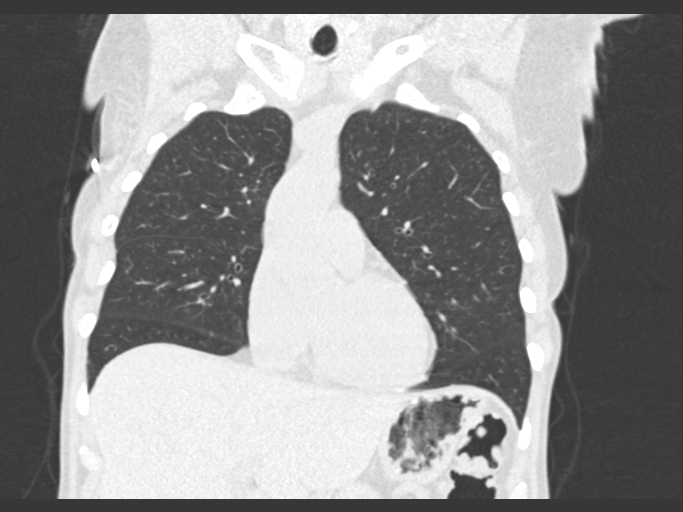
[im 77/128  lung]
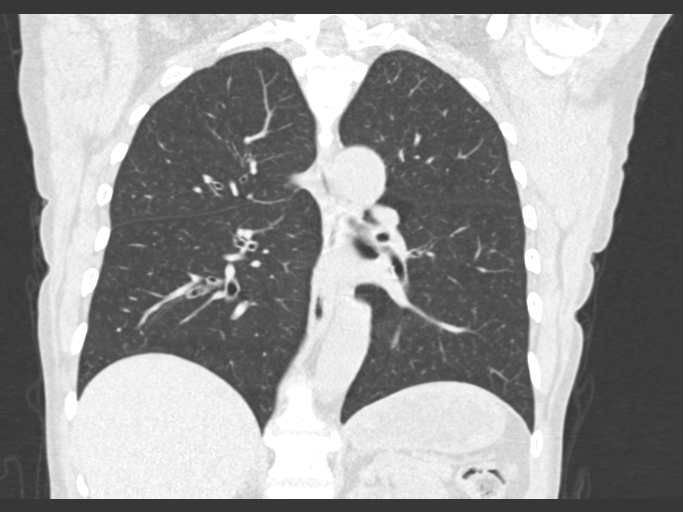

[15 of 36 positions shown; findings below may reference images not displayed]

FINDINGS: Cardiovascular: Heart is normal size. Aorta is normal caliber.
Scattered aortic calcifications.

Mediastinum/Nodes: No mediastinal, hilar, or axillary adenopathy.
Trachea and esophagus are unremarkable. Thyroid unremarkable.

Lungs/Pleura: Lungs are clear. No focal airspace opacities or
suspicious nodules. No effusions. No nodule anteriorly as questioned
on prior chest x-ray.

Upper Abdomen: Imaging into the upper abdomen demonstrates no acute
findings.

Musculoskeletal: Chest wall soft tissues are unremarkable. No acute
bony abnormality.
IMPRESSION: No pulmonary nodules as questioned on prior chest x-ray.

No acute cardiopulmonary disease.

Aortic Atherosclerosis ([id number]).

## 2023-08-10 ENCOUNTER — Ambulatory Visit
Admission: EM | Admit: 2023-08-10 | Discharge: 2023-08-10 | Disposition: A | Discharge status: Home / Self Care | Payer: BLUE CROSS/BLUE SHIELD

## 2023-08-10 DIAGNOSIS — Z0289 Encounter for other administrative examinations: Secondary | ICD-10-CM

## 2023-08-10 DIAGNOSIS — R195 Other fecal abnormalities: Secondary | ICD-10-CM

## 2023-08-10 DIAGNOSIS — K30 Functional dyspepsia: Secondary | ICD-10-CM

## 2023-08-10 NOTE — ED Provider Notes (Signed)
[facility]    CSN: [id number] Arrival date & time: 08/10/23  1835      History   Chief Complaint Chief Complaint  Patient presents with   Diarrhea    HPI Joyce Ballard is a 57 y.o. female.   Patient presents today requesting a work excuse note.  Reports that earlier today she was at work (works at [employer]) when she had a loose bowel movement.  She then left work and returned home where she had another loose bowel movement.  She denies any abdominal pain, nausea, vomiting, melena, hematochezia.  She believes symptoms were triggered by something that she ate as she did eat a fatty meal at her place of employment.  She denies any history of gastrointestinal disorder.  Reports that she has since been feeling fine and is currently asymptomatic.  She has not had a bowel movement since approximately 1030 this morning and does not have any abdominal pain, fever, nausea, vomiting.  She has been eating and drinking normally.  Denies any medication changes.  She denies previous abdominal surgery outside of tubal ligation.  She feels she can return to work tomorrow but does need excuse for today.  She has no additional complaints or concerns today.    Past Medical History:  Diagnosis Date   Anxiety    COPD (chronic obstructive pulmonary disease) (HCC)    Depression    Hypertension    PTSD (post-traumatic stress disorder)     Patient Active Problem List   Diagnosis Date Noted   Hypertension 11/19/2021   Chronic obstructive pulmonary disease (HCC) 11/19/2021   Abnormal CXR 11/19/2021    Past Surgical History:  Procedure Laterality Date   TUBAL LIGATION      OB History   No obstetric history on file.      Home Medications    Prior to Admission medications   Medication Sig Start Date End Date Taking? Authorizing Provider  albuterol (PROAIR HFA) 108 (90 Base) MCG/ACT inhaler Inhale 1-2 puffs into the lungs every 6 (six) hours as needed for wheezing or shortness of  breath. 04/13/22   Hoy Register, MD  hydrochlorothiazide (HYDRODIURIL) 25 MG tablet Take 1 tablet (25 mg total) by mouth daily. 11/19/21   Anders Simmonds, PA-C  hydrochlorothiazide (HYDRODIURIL) 25 MG tablet Take 1 tablet (25 mg total) by mouth daily. 01/11/23     PARoxetine (PAXIL) 20 MG tablet Take 1 tablet (20 mg total) by mouth daily. 12/01/21   Georganna Skeans, MD  sertraline (ZOLOFT) 50 MG tablet Take 1/2 tablet (25 mg total) by mouth daily for 7 days, THEN 1 tablet (50 mg total) daily. Take in the morning with food. 01/11/23 02/12/23    traZODone (DESYREL) 50 MG tablet Take 1 tablet (50 mg total) by mouth at bedtime for insomnia. 01/11/23     umeclidinium bromide (INCRUSE ELLIPTA) 62.5 MCG/ACT AEPB Inhale 1 puff into the lungs daily. 06/18/22   Hoy Register, MD  umeclidinium bromide (INCRUSE ELLIPTA) 62.5 MCG/ACT AEPB Inhale 1 puff into the lungs daily. 01/11/23       Family History Family History  Problem Relation Age of Onset   Hypertension Mother    Cancer Mother    Depression Mother    Post-traumatic stress disorder Mother     Social History Social History   Tobacco Use   Smoking status: Every Day    Current packs/day: 1.00    Types: Cigarettes   Smokeless tobacco: Never  Vaping Use   Vaping status:  Never Used  Substance Use Topics   Alcohol use: Yes    Alcohol/week: 21.0 standard drinks of alcohol    Types: 21 Cans of beer per week   Drug use: Yes    Types: Marijuana     Allergies   Patient has no known allergies.   Review of Systems Review of Systems  Constitutional:  Negative for activity change, appetite change, fatigue and fever.  Gastrointestinal:  Negative for abdominal pain, blood in stool, diarrhea (Resolved), nausea and vomiting.  Genitourinary:  Negative for dysuria, frequency, urgency, vaginal bleeding, vaginal discharge and vaginal pain.     Physical Exam Triage Vital Signs ED Triage Vitals  Encounter Vitals Group     BP 08/10/23 1855 111/73      Systolic BP Percentile --      Diastolic BP Percentile --      Pulse Rate 08/10/23 1855 72     Resp 08/10/23 1855 18     Temp --      Temp Source 08/10/23 1855 Oral     SpO2 08/10/23 1855 94 %     Weight --      Height --      Head Circumference --      Peak Flow --      Pain Score 08/10/23 1854 0     Pain Loc --      Pain Education --      Exclude from Growth Chart --    No data found.  Updated Vital Signs BP 111/73 (BP Location: Right Arm)   Pulse 72   Resp 18   SpO2 94%   Visual Acuity Right Eye Distance:   Left Eye Distance:   Bilateral Distance:    Right Eye Near:   Left Eye Near:    Bilateral Near:     Physical Exam Vitals reviewed.  Constitutional:      General: She is awake. She is not in acute distress.    Appearance: Normal appearance. She is well-developed. She is not ill-appearing.     Comments: Very pleasant female appears stated age in no acute distress sitting comfortably in exam room  HENT:     Head: Normocephalic and atraumatic.     Mouth/Throat:     Mouth: Mucous membranes are moist.     Pharynx: Uvula midline. No oropharyngeal exudate or posterior oropharyngeal erythema.  Cardiovascular:     Rate and Rhythm: Normal rate and regular rhythm.     Heart sounds: Normal heart sounds, S1 normal and S2 normal. No murmur heard. Pulmonary:     Effort: Pulmonary effort is normal.     Breath sounds: Normal breath sounds. No wheezing, rhonchi or rales.     Comments: Clear to auscultation bilaterally Abdominal:     General: Bowel sounds are normal.     Palpations: Abdomen is soft.     Tenderness: There is no abdominal tenderness. There is no right CVA tenderness, left CVA tenderness, guarding or rebound.     Comments: Benign abdominal exam.  No tenderness palpation.  Psychiatric:        Behavior: Behavior is cooperative.      UC Treatments / Results  Labs (all labs ordered are listed, but only abnormal results are displayed) Labs Reviewed -  No data to display  EKG   Radiology No results found.  Procedures Procedures (including critical care time)  Medications Ordered in UC Medications - No data to display  Initial Impression / Assessment and Plan /  UC Course  I have reviewed the triage vital signs and the nursing notes.  Pertinent labs & imaging results that were available during my care of the patient were reviewed by me and considered in my medical decision making (see chart for details).     Patient is well-appearing, afebrile, nontoxic, nontachycardic.  Vital signs of physical exam are reassuring with no indication for emergent evaluation or imaging.  She is currently symptomatic and symptoms have resolved.  Suspect they are triggered by her fatty meal.  Recommend that she eat a bland diet and drink plenty of fluids.  Discussed that as long as her symptoms do not recur she can return to work tomorrow and she was provided a work excuse note.  Discussed alarm symptoms that warrant emergent evaluation.  Strict return precautions given.  All questions answered to patient satisfaction.  Final Clinical Impressions(s) / UC Diagnoses   Final diagnoses:  Loose stools  Stomach upset  Encounter to obtain excuse from work     Discharge Instructions      I am glad that you are feeling better.  Eat a bland diet such as the brat diet (bananas, rice, applesauce, toast).  Avoid spicy/acidic/fatty foods.  If you have any recurrent symptoms including diarrhea, blood in your stool, nausea, vomiting, fever, severe abdominal pain be seen immediately.     ED Prescriptions   None    PDMP not reviewed this encounter.   Jeani Hawking, PA-C 08/10/23 1927

## 2023-08-10 NOTE — ED Triage Notes (Signed)
Pt presents with c/o diarrhea x 1 day. Pt states shew needs a return to work note.

## 2023-08-10 NOTE — Discharge Instructions (Signed)
I am glad that you are feeling better.  Eat a bland diet such as the brat diet (bananas, rice, applesauce, toast).  Avoid spicy/acidic/fatty foods.  If you have any recurrent symptoms including diarrhea, blood in your stool, nausea, vomiting, fever, severe abdominal pain be seen immediately.

## 2023-12-07 ENCOUNTER — Other Ambulatory Visit: Payer: Self-pay

## 2023-12-07 ENCOUNTER — Emergency Department (HOSPITAL_COMMUNITY)
Admission: EM | Admit: 2023-12-07 | Discharge: 2023-12-07 | Disposition: A | Discharge status: Home / Self Care | Payer: BLUE CROSS/BLUE SHIELD | Attending: Emergency Medicine | Admitting: Emergency Medicine

## 2023-12-07 ENCOUNTER — Emergency Department (HOSPITAL_COMMUNITY): Payer: BLUE CROSS/BLUE SHIELD

## 2023-12-07 ENCOUNTER — Encounter (HOSPITAL_COMMUNITY): Payer: Self-pay

## 2023-12-07 DIAGNOSIS — J449 Chronic obstructive pulmonary disease, unspecified: Secondary | ICD-10-CM | POA: Diagnosis not present

## 2023-12-07 DIAGNOSIS — I10 Essential (primary) hypertension: Secondary | ICD-10-CM | POA: Diagnosis present

## 2023-12-07 DIAGNOSIS — Z79899 Other long term (current) drug therapy: Secondary | ICD-10-CM | POA: Diagnosis not present

## 2023-12-07 DIAGNOSIS — R531 Weakness: Secondary | ICD-10-CM | POA: Diagnosis not present

## 2023-12-07 LAB — TROPONIN I (HIGH SENSITIVITY): Troponin I (High Sensitivity): 2 ng/L (ref <18)

## 2023-12-07 LAB — CBC
HCT: 38.9 % (ref 36.0–46.0)
Hemoglobin: 12.4 g/dL (ref 12.0–15.0)
MCH: 31 pg (ref 26.0–34.0)
MCHC: 31.9 g/dL (ref 30.0–36.0)
MCV: 97.3 fL (ref 80.0–100.0)
Platelets: 408 10*3/uL — ABNORMAL HIGH (ref 150–400)
RBC: 4 MIL/uL (ref 3.87–5.11)
RDW: 13.6 % (ref 11.5–15.5)
WBC: 6.3 10*3/uL (ref 4.0–10.5)
nRBC: 0 % (ref 0.0–0.2)

## 2023-12-07 LAB — COMPREHENSIVE METABOLIC PANEL
ALT: 75 U/L — ABNORMAL HIGH (ref 0–44)
AST: 83 U/L — ABNORMAL HIGH (ref 15–41)
Albumin: 4 g/dL (ref 3.5–5.0)
Alkaline Phosphatase: 99 U/L (ref 38–126)
Anion gap: 8 (ref 5–15)
BUN: 22 mg/dL — ABNORMAL HIGH (ref 6–20)
CO2: 24 mmol/L (ref 22–32)
Calcium: 9.2 mg/dL (ref 8.9–10.3)
Chloride: 103 mmol/L (ref 98–111)
Creatinine, Ser: 0.94 mg/dL (ref 0.44–1.00)
GFR, Estimated: >60 mL/min (ref >60)
Glucose, Bld: 91 mg/dL (ref 70–99)
Potassium: 4.1 mmol/L (ref 3.5–5.1)
Sodium: 135 mmol/L (ref 135–145)
Total Bilirubin: 0.6 mg/dL (ref 0.0–1.2)
Total Protein: 7.5 g/dL (ref 6.5–8.1)

## 2023-12-07 LAB — MAGNESIUM: Magnesium: 2.2 mg/dL (ref 1.7–2.4)

## 2023-12-07 MED ORDER — HYDROCHLOROTHIAZIDE 25 MG PO TABS: 25.0000 mg | ORAL_TABLET | Freq: Every day | ORAL | 0 refills | Status: DC

## 2023-12-07 MED ORDER — SODIUM CHLORIDE (PF) 0.9 % IJ SOLN
INTRAMUSCULAR | Status: AC
  Filled 2023-12-07: qty 50

## 2023-12-07 MED ORDER — SODIUM CHLORIDE 0.9 % IV BOLUS: 500.0000 mL | Freq: Once | INTRAVENOUS | Status: DC

## 2023-12-07 MED ORDER — HYDROCHLOROTHIAZIDE 25 MG PO TABS
25.0000 mg | ORAL_TABLET | Freq: Once | ORAL | Status: AC
  Administered 2023-12-07: 25 mg via ORAL
  Filled 2023-12-07: qty 1

## 2023-12-07 MED ORDER — ASPIRIN 325 MG PO TABS
325.0000 mg | ORAL_TABLET | Freq: Every day | ORAL | Status: DC
  Administered 2023-12-07: 325 mg via ORAL
  Filled 2023-12-07: qty 1

## 2023-12-07 MED ORDER — IOHEXOL 350 MG/ML SOLN
75.0000 mL | Freq: Once | INTRAVENOUS | Status: AC | PRN
  Administered 2023-12-07: 75 mL via INTRAVENOUS

## 2023-12-07 NOTE — ED Provider Triage Note (Signed)
Emergency Medicine Provider Triage Evaluation Note  Joyce Ballard , a 58 y.o. female  was evaluated in triage.  Pt complains of HTN .  Review of Systems  Positive: High blood pressure Negative: SOB  Physical Exam  BP (!) 163/97   Pulse 72   Temp 98.3 F (36.8 C) (Oral)   Resp 16   Ht 5\' 5"  (1.651 m)   Wt 65.8 kg   SpO2 100%   BMI 24.14 kg/m  Gen:   Awake, no distress   Resp:  Normal effort  MSK:   Moves extremities without difficulty  Other:    Medical Decision Making  Medically screening exam initiated at 10:06 AM.  Appropriate orders placed.  Joyce Ballard was informed that the remainder of the evaluation will be completed by another provider, this initial triage assessment does not replace that evaluation, and the importance of remaining in the ED until their evaluation is complete.  Reports heart pounding, "something pulling" but no pain x 1 week. No fever, cough, nausea or vomiting.   History of untreated HTN, tobacco and ETOH abuse daily, h/o COPD   Elpidio Anis, PA-C 12/07/23 1008

## 2023-12-07 NOTE — ED Triage Notes (Addendum)
Pt arrive reporting hx of HBP. Reports has not taken her meds in over a year. States doesn't like how it makes her feel. Endorses feeling weak, blurred vision to left eye and "head boggy" for a few weeks. Pt drinks, smokes and also states has COPD. States chest feels"funny" Denies any other symptoms

## 2023-12-07 NOTE — ED Provider Notes (Signed)
 [place] EMERGENCY DEPARTMENT AT [facility] Provider Note   CSN: [id number] Arrival date & time: 12/07/23  0941     History  Chief Complaint  Patient presents with   Hypertension   Blurred Vision   Weakness    Joyce Ballard is a 58 y.o. female with a history of hypertension, COPD, and anxiety who presents the ED today for multiple concerns.  Patient reports that she has been off of her blood pressure medication for the past year is concerned for about elevated blood pressure readings.  She also states that she has been having intermittent blurred vision in the left eye as well as chest discomfort.  She denies any fevers, neurologic focal deficits, or shortness of breath.  No fevers, nausea, vomiting, or diarrhea.  No additional complaints or concerns at this time.    Home Medications Prior to Admission medications   Medication Sig Start Date End Date Taking? Authorizing Provider  hydrochlorothiazide (HYDRODIURIL) 25 MG tablet Take 1 tablet (25 mg total) by mouth daily. 12/07/23 03/06/24 Yes Maxwell Marion, PA-C  albuterol [facility] HFA) 108 (90 Base) MCG/ACT inhaler Inhale 1-2 puffs into the lungs every 6 (six) hours as needed for wheezing or shortness of breath. 04/13/22   Hoy Register, MD  PARoxetine (PAXIL) 20 MG tablet Take 1 tablet (20 mg total) by mouth daily. 12/01/21   Georganna Skeans, MD  sertraline (ZOLOFT) 50 MG tablet Take 1/2 tablet (25 mg total) by mouth daily for 7 days, THEN 1 tablet (50 mg total) daily. Take in the morning with food. 01/11/23 02/12/23    traZODone (DESYREL) 50 MG tablet Take 1 tablet (50 mg total) by mouth at bedtime for insomnia. 01/11/23     umeclidinium bromide (INCRUSE ELLIPTA) 62.5 MCG/ACT AEPB Inhale 1 puff into the lungs daily. 06/18/22   Hoy Register, MD  umeclidinium bromide (INCRUSE ELLIPTA) 62.5 MCG/ACT AEPB Inhale 1 puff into the lungs daily. 01/11/23         Allergies    Patient has no known allergies.    Review of Systems    Review of Systems  Eyes:  Positive for visual disturbance.  Neurological:  Positive for weakness.  All other systems reviewed and are negative.   Physical Exam Updated Vital Signs BP (!) 147/78 (BP Location: Left Arm)   Pulse (!) 58   Temp 98.2 F (36.8 C) (Oral)   Resp 16   Ht 5\' 5"  (1.651 m)   Wt 65.8 kg   SpO2 100%   BMI 24.14 kg/m  Physical Exam Vitals and nursing note reviewed.  Constitutional:      General: She is not in acute distress.    Appearance: Normal appearance.  HENT:     Head: Normocephalic and atraumatic.     Mouth/Throat:     Mouth: Mucous membranes are moist.  Eyes:     Extraocular Movements: Extraocular movements intact.     Conjunctiva/sclera: Conjunctivae normal.     Pupils: Pupils are equal, round, and reactive to light.     Comments: Visual Acuity Bilateral Near: 20/50 Bilateral Distance: 20/50 R Near: 20/50 R Distance: 20/50 L Near: 20/70 L Distance: 20/70    Cardiovascular:     Rate and Rhythm: Normal rate and regular rhythm.     Pulses: Normal pulses.  Pulmonary:     Effort: Pulmonary effort is normal.     Breath sounds: Normal breath sounds.  Abdominal:     Palpations: Abdomen is soft.     Tenderness:  There is no abdominal tenderness.  Musculoskeletal:        General: Normal range of motion.     Cervical back: Normal range of motion.  Skin:    General: Skin is warm and dry.     Findings: No rash.  Neurological:     General: No focal deficit present.     Mental Status: She is alert.     Sensory: No sensory deficit.     Motor: No weakness.  Psychiatric:        Mood and Affect: Mood normal.        Behavior: Behavior normal.    ED Results / Procedures / Treatments   Labs (all labs ordered are listed, but only abnormal results are displayed) Labs Reviewed  CBC - Abnormal; Notable for the following components:      Result Value   Platelets 408 (*)    All other components within normal limits  COMPREHENSIVE METABOLIC  PANEL - Abnormal; Notable for the following components:   BUN 22 (*)    AST 83 (*)    ALT 75 (*)    All other components within normal limits  MAGNESIUM  TROPONIN I (HIGH SENSITIVITY)  TROPONIN I (HIGH SENSITIVITY)    EKG EKG Interpretation Date/Time:  Tuesday December 07 2023 09:55:33 EST Ventricular Rate:  63 PR Interval:  141 QRS Duration:  83 QT Interval:  419 QTC Calculation: 429 R Axis:   100  Text Interpretation: Right and left arm electrode reversal, interpretation assumes no reversal Sinus rhythm ST elevations in inferior leads appears overall similar to ECG of 06/30/2022 Minimal ST elevation, anterior leads Confirmed by Estelle June [phone]) on 12/07/2023 11:08:19 AM  Radiology CT Angio Head W or Wo Contrast Result Date: 12/07/2023 CLINICAL DATA:  Cerebral aneurysm screening, high risk. Hypertension. Weakness. Blurred vision. EXAM: CT ANGIOGRAPHY HEAD TECHNIQUE: Multidetector CT imaging of the head was performed using the standard protocol during bolus administration of intravenous contrast. Multiplanar CT image reconstructions and MIPs were obtained to evaluate the vascular anatomy. RADIATION DOSE REDUCTION: This exam was performed according to the departmental dose-optimization program which includes automated exposure control, adjustment of the mA and/or kV according to patient size and/or use of iterative reconstruction technique. CONTRAST:  75mL OMNIPAQUE IOHEXOL 350 MG/ML SOLN COMPARISON:  None Available. FINDINGS: CT HEAD Brain: The brain shows a normal appearance without evidence of malformation, atrophy, old or acute small or large vessel infarction, mass lesion, hemorrhage, hydrocephalus or extra-axial collection. Vascular: No hyperdense vessel. No evidence of atherosclerotic calcification. Skull: Normal.  No traumatic finding.  No focal bone lesion. Sinuses/Orbits: Sinuses are clear. Orbits appear normal. Mastoids are clear. Other: None significant CTA HEAD Anterior  circulation: Both internal carotid arteries are patent through the skull base and siphon regions. No siphon stenosis. The anterior and middle cerebral vessels are normal. No stenosis or occlusion. No aneurysm or vascular malformation. Fetal origin left PCA. Posterior circulation: Both vertebral arteries widely patent to the basilar artery. No basilar stenosis. Posterior circulation branch vessels are normal. Venous sinuses: Patent and normal. Anatomic variants: None significant. Review of the MIP images confirms the above findings. IMPRESSION: 1. Normal head CT. 2. Normal intracranial CT angiography. Electronically Signed   By: Paulina Fusi M.D.   On: 12/07/2023 17:09   DG Chest 2 View Result Date: 12/07/2023 CLINICAL DATA:  palpitations, abnormal EKG. EXAM: CHEST - 2 VIEW COMPARISON:  06/30/2022. FINDINGS: Bilateral lung fields are clear. Bilateral costophrenic angles are clear. Normal cardio-mediastinal silhouette.  No acute osseous abnormalities. The soft tissues are within normal limits. IMPRESSION: No active cardiopulmonary disease. Electronically Signed   By: Jules Schick M.D.   On: 12/07/2023 14:17    Procedures Procedures: not indicated.   Medications Ordered in ED Medications  aspirin tablet 325 mg (325 mg Oral Given 12/07/23 1017)  hydrochlorothiazide (HYDRODIURIL) tablet 25 mg (25 mg Oral Given 12/07/23 1355)  iohexol (OMNIPAQUE) 350 MG/ML injection 75 mL (75 mLs Intravenous Contrast Given 12/07/23 1605)    ED Course/ Medical Decision Making/ A&P                                 Medical Decision Making Amount and/or Complexity of Data Reviewed Radiology: ordered.  Risk Prescription drug management.   This patient presents to the ED for concern of elevated blood pressure and chest pain, this involves an extensive number of treatment options, and is a complaint that carries with it a high risk of complications and morbidity.   Differential diagnosis includes: ACS, hypertensive  emergency, hypertensive urgency, musculoskeletal pain, costochondritis, pleurisy, etc.   Comorbidities  See HPI above   Additional History  Additional history obtained from prior records   Cardiac Monitoring / EKG  The patient was maintained on a cardiac monitor.  I personally viewed and interpreted the cardiac monitored which showed: Sinus rhythm with a heart rate of 63 bpm.   Lab Tests  I ordered and personally interpreted labs.  The pertinent results include:   Negative troponin Elevated BUN of 22, AST of 83, ALT of 75 otherwise CMP, lipase, and CBC within normal limits Patient reports that she drinks Heineken daily Magnesium is unremarkable   Imaging Studies  I ordered imaging studies including chest x-ray and CT angiography of the head I independently visualized and interpreted imaging which showed:  Chest x-ray shows no acute cardiopulmonary disease CT head showed was unremarkable. I agree with the radiologist interpretation   Problem List / ED Course / Critical Interventions / Medication Management  Concern for elevated blood pressure after not taking medications for the past year.  Intermittent left eye blurred vision and chest discomfort. I ordered medications including: Hydrochlorothiazide for blood pressure Oral hydration for elevated BUN Reevaluation of the patient after these medicines showed that the patient improved I have reviewed the patients home medicines and have made adjustments as needed Discussed findings with patient.  All questions were answered. Refilled prescription for hydrochlorothiazide.  Information given for primary care provider for patient to follow-up with.  Advise following up with eye doctor for further evaluation of vision changes.   Social Determinants of Health  Access to healthcare   Test / Admission - Considered  Patient is stable and safe discharge home. Return precautions given.       Final Clinical  Impression(s) / ED Diagnoses Final diagnoses:  Hypertension, unspecified type    Rx / DC Orders ED Discharge Orders          Ordered    hydrochlorothiazide (HYDRODIURIL) 25 MG tablet  Daily        12/07/23 1810              Maxwell Marion, PA-C 12/07/23 1855    Elayne Snare K, DO 12/15/23 [phone]

## 2023-12-07 NOTE — Discharge Instructions (Addendum)
As discussed, your labs and imaging are reassuring.  Open information for primary care provider free to follow-up with for further evaluation for your blood pressure.  Take the hydrochlorothiazide once a day to help control your blood pressure.  Your imaging did not show any acute causes of your left eye blurred vision.  Follow-up with an eye doctor for further evaluation.  Get help right away if you: Develop a severe headache or confusion. Have unusual weakness or numbness. Feel faint. Have severe pain in your chest or abdomen. Vomit repeatedly. Have trouble breathing.

## 2023-12-16 ENCOUNTER — Ambulatory Visit: Payer: BLUE CROSS/BLUE SHIELD | Admitting: Family Medicine

## 2023-12-23 ENCOUNTER — Encounter: Payer: Self-pay | Admitting: Family Medicine

## 2023-12-23 ENCOUNTER — Ambulatory Visit (INDEPENDENT_AMBULATORY_CARE_PROVIDER_SITE_OTHER): Payer: BLUE CROSS/BLUE SHIELD | Admitting: Family Medicine

## 2023-12-23 VITALS — BP 130/72 | HR 77 | Ht 65.0 in | Wt 150.1 lb

## 2023-12-23 DIAGNOSIS — F419 Anxiety disorder, unspecified: Secondary | ICD-10-CM | POA: Diagnosis present

## 2023-12-23 DIAGNOSIS — F1990 Other psychoactive substance use, unspecified, uncomplicated: Secondary | ICD-10-CM | POA: Diagnosis not present

## 2023-12-23 DIAGNOSIS — I10 Essential (primary) hypertension: Secondary | ICD-10-CM | POA: Diagnosis not present

## 2023-12-23 DIAGNOSIS — Z139 Encounter for screening, unspecified: Secondary | ICD-10-CM

## 2023-12-23 DIAGNOSIS — Z0000 Encounter for general adult medical examination without abnormal findings: Secondary | ICD-10-CM

## 2023-12-23 LAB — POCT GLYCOSYLATED HEMOGLOBIN (HGB A1C): Hemoglobin A1C: 5.4 % (ref 4.0–5.6)

## 2023-12-23 MED ORDER — QUETIAPINE FUMARATE 25 MG PO TABS
25.0000 mg | ORAL_TABLET | Freq: Every evening | ORAL | 0 refills | Status: AC | PRN
Start: 2023-12-23 — End: ?

## 2023-12-23 MED ORDER — SERTRALINE HCL 50 MG PO TABS: ORAL_TABLET | ORAL | 0 refills | Status: DC

## 2023-12-23 NOTE — Patient Instructions (Addendum)
 Thank you for visiting clinic today - it is always our pleasure to care for you.  Today we discussed how you've been feeling overall. I have placed a referral for you to connect with Behavioral Health. Someone will be contacting you to scheduled an appointment. I have also placed a referral to our Social Work services, and someone will reach out to you soon. Otherwise please see below for therapy resources you may reach out to if you're interested.   Please take your blood pressure and COPD medications daily.   Please schedule an appointment in 2 weeks to check in on your mood.   You are also scheduled to see our pharmacist Dr Raymondo Band on Monday 12/27/23 at 10:30 AM to further discuss smoking cessation.   Reach out any time with any questions or concerns you may have - we are here for you!  Ivery Quale, MD [facility] [phone]  Therapy and Counseling Resources Most providers on this list will take Medicaid. Patients with commercial insurance or Medicare should contact their insurance company to get a list of in network providers.  [facility]:Psychiatry and Counseling [address] [facility] [place] [place], [place] [address] [phone]  [facility]  [address], Suite [place], [place] [address]      [phone]  [facility] [address]  [place], [place] [address] [phone]  [facility] [address]  [place], [place] [address]       [phone]     [facility] (virtual only) [phone]  Jovita Kussmaul [facility] [address] [place], [place] [phone]  [facility]:  [address] [place] [phone]  [facility]:  [address] [place] [place]iener [phone]  [facility] (under & uninsured) [address]   [place] [phone]    [email or web address]    [facility] [address]  Dr.  [place]inette [place]    [phone]  [facility] -[address]     Phone:  [phone]     [facility]-  [address]  [phone]   [facility] [address]      [place], [place] [phone]  [facility]: [address] [place], [place], [place]  [phone]   [facility] (Spanish therapist) [email or web address]  [address]  [address]   [place] [address]    [phone]    [facility]   [address],  [place], [place]  [phone]   [facility]  [address] [place]  [phone]  [facility]  [address] [place], [place]        [phone]  Open Access/Walk In Clinic under & uninsured  [facility]  [address] [place], [place] [phone] Crisis [phone]  [facility] [place],  (Spanish)   [address], [place]: [phone]) 8:30 - 12; 1 - 2:30  [facility] [employer],  [address] [place], [place]    [phone]):8:30 - 12; 2 - 3PM  [facility] [employer],  [address],  [place]; [phone]):   Mon - Fri 8 AM - 5 PM  [facility] [address] [place]   MWF 12:30 to 3:00 or call to schedule an appointment  [phone]  Specific Provider options Psychology Today  [email or web address].psychologytoday.com/us click on find a therapist  enter your zip code left side and select or tailor a therapist for your specific need.   [facility] Provider Directory [email or web address]  (Medicaid)   Follow all drop down to find a provider  Social Support program [facility] Health [place] [phone] or PhotoSolver.pl [address], [place], [place] Recovery support and educational   24- Hour Availability:   [facility]  [address]  [place], [place] [phone] Crisis [phone]  [facility] [phone]  [place] [facility]  [phone]   [facility]  [phone] (after hours)  Therapeutic Alternative/Mobile Crisis   [phone]  [place] National Suicide Hotline  9368774668 Len Childs)  Call 911 or go to emergency room  [facility]  [phone]);  [place] and [employer]  [phone]); [place], [place], [place], [place], [place], [place], [place]

## 2023-12-23 NOTE — Progress Notes (Signed)
     SUBJECTIVE:   CHIEF COMPLAINT / HPI: Establish new patient care  Anxiety Patient reports long history of anxiety and difficulty with overwhelming thoughts. About a year ago tried paxil and zoloft for about a month, did not refill either of these. Endorses use of non-medication substances to cope with symptoms. Has not tried therapy before, is open to trying it.  When asked, does report seeing shadows at times and hearing things that others may not. No current SI/HI. Seeking improved control of her symptoms.   COPD Long hx of smoking tobacco and other substances. Does not use her Incruse Ellipta daily as prescribed. Is able to walk around well, not run. Uses albuterol up to a couple times a week.   Substance use Reports drinking 3 large beers daily. Reports long former history of cocaine use. Reports long history of smoking about a pack a day and CBD products. Is wanting to quit. Feels not in control.   PERTINENT  PMH / PSH: Anxiety, HTN, COPD, substance use  OBJECTIVE:   BP 130/72   Pulse 77   Ht 5\' 5"  (1.651 m)   Wt 150 lb 2 oz (68.1 kg)   SpO2 98%   BMI 24.98 kg/m   General: No acute distress. Resting comfortably in room. CV: Normal S1/S2. No extra heart sounds. Warm and well-perfused. Pulm: Breathing comfortably on room air. CTAB. No increased WOB. Abd: Soft, non-tender, non-distended. Skin:  Warm, dry. Psych: Pleasant and appropriate.    ASSESSMENT/PLAN:   Assessment & Plan Anxiety Worsening anxiety symptoms, with concern for more involved psychiatric disorder given AVH. No SI/HI currently. MDQ of 4.  - Psych referral placed  - Discussed Seroquel 25 nightly as needed  - Provided therapy resources in handout  Primary hypertension Takes hydrochlorothiazide 25 daily. BP well controlled at this time. - Cont home medication as previously prescribed  Encounter for screening involving social determinants of health [facility]) - Patient shares transportation barriers -  [facility] order placed  Healthcare maintenance A1c of 5.4 No further intervention at this time. Substance use Patient interested in quitting at this time - Appt made with Dr Raymondo Band next week - Provided therapy resources in handout     Return to clinic in 2 weeks to follow up and address more healthcare maintenance.  Joyce Quale, MD [facility]

## 2023-12-24 ENCOUNTER — Telehealth: Payer: Self-pay | Admitting: *Deleted

## 2023-12-24 ENCOUNTER — Telehealth: Payer: Self-pay

## 2023-12-24 NOTE — Progress Notes (Signed)
 Complex Care Management Note Care Guide Note  12/24/2023 Name: Joyce Ballard MRN: [id number] DOB: [birth year]   Complex Care Management Outreach Attempts: An unsuccessful telephone outreach was attempted today to offer the patient information about available complex care management services.  Follow Up Plan:  Additional outreach attempts will be made to offer the patient complex care management information and services.   Encounter Outcome:  No Answer  Mckinley Olheiser [facility]  [facility] Guide Direct Dial: [phone]  Fax: [phone] Website: [email or web address]

## 2023-12-24 NOTE — Progress Notes (Signed)
 Complex Care Management Note  Care Guide Note 12/24/2023 Name: Joyce Ballard MRN: [id number] DOB: 1965-10-28  Joyce Ballard is a 58 y.o. year old female who sees Ivery Quale, MD for primary care. I reached out to Kristopher Oppenheim by phone today to offer complex care management services.  Joyce Ballard was given information about Complex Care Management services today including:   The Complex Care Management services include support from the care team which includes your Nurse Care Manager, Clinical Social Worker, or Pharmacist.  The Complex Care Management team is here to help remove barriers to the health concerns and goals most important to you. Complex Care Management services are voluntary, and the patient may decline or stop services at any time by request to their care team member.   Complex Care Management Consent Status: Patient agreed to services and verbal consent obtained.   Follow up plan:  Telephone appointment with complex care management team member scheduled for:  12/28/23  Encounter Outcome:  Patient Scheduled

## 2023-12-25 DIAGNOSIS — F419 Anxiety disorder, unspecified: Secondary | ICD-10-CM | POA: Insufficient documentation

## 2023-12-25 NOTE — Assessment & Plan Note (Signed)
 Worsening anxiety symptoms, with concern for more involved psychiatric disorder given AVH. No SI/HI currently. MDQ of 4.  - Psych referral placed  - Discussed Seroquel 25 nightly as needed  - Provided therapy resources in handout

## 2023-12-25 NOTE — Assessment & Plan Note (Signed)
 Takes hydrochlorothiazide 25 daily. BP well controlled at this time. - Cont home medication as previously prescribed

## 2023-12-27 ENCOUNTER — Ambulatory Visit: Payer: BLUE CROSS/BLUE SHIELD | Admitting: Pharmacist

## 2023-12-28 ENCOUNTER — Ambulatory Visit: Payer: Self-pay | Admitting: Licensed Clinical Social Worker

## 2023-12-28 NOTE — Patient Outreach (Signed)
 Care Coordination   Initial Visit Note   12/28/2023 Name: Joyce Ballard MRN: [id number] DOB: 05/30/1966  Joyce Ballard is a 58 y.o. year old female who sees Ivery Quale, MD for primary care. I spoke with  Kristopher Oppenheim by phone today.  What matters to the patients health and wellness today?  Pt requested assistance with transportation for medicaid appts. LCSW A. Felton Clinton called [facility] DSS was advise pt has managed medicaid and transportation is set with [employer] at [phone]. Pt also advised she is already set up a psychiatrist and has a pending appt. Pt reports she does not desire further services from [facility].    Goals Addressed   None     SDOH assessments and interventions completed:  Yes  SDOH Interventions Today    Flowsheet Row Most Recent Value  SDOH Interventions   Food Insecurity Interventions Intervention Not Indicated  Housing Interventions Intervention Not Indicated  Transportation Interventions Community Resources Provided  [Advise pt to contact [facility] managed medicaid for tranpsortation assistance [phone]        Care Coordination Interventions:  Yes, provided   Interventions Today    Flowsheet Row Most Recent Value  Education Interventions   Education Provided Provided Education  Provided Verbal Education On [employer] requested assistance w/ transportation. Encourage pt to contact [facility] [place] medicaid [id number].]  Mental Health Interventions   Mental Health Discussed/Reviewed Mental Health Reviewed, Other  [pt reports appt is scheduled with psychiatrist.]       Follow up plan: No further intervention required.   Encounter Outcome:  Patient Visit Completed   Gwyndolyn Saxon MSW, LCSW Licensed Clinical Social Worker  [facility], [facility] Health Direct Dial: [phone]  Fax: [phone]

## 2023-12-28 NOTE — Patient Instructions (Signed)
 Visit Information  Thank you for taking time to visit with me today. Please don't hesitate to contact me if I can be of assistance to you.   Following are the goals we discussed today:   Goals Addressed   None        Please call the care guide team at [phone] if you need to cancel or reschedule your appointment.   If you are experiencing a Mental Health or Behavioral Health Crisis or need someone to talk to, please call 911   Patient verbalizes understanding of instructions and care plan provided today and agrees to view in MyChart. Active MyChart status and patient understanding of how to access instructions and care plan via MyChart confirmed with patient.     The patient has been provided with contact information for the care management team and has been advised to call with any health related questions or concerns.   Gwyndolyn Saxon MSW, LCSW Licensed Clinical Social Worker  [facility], [facility] Direct Dial: [phone]  Fax: [phone]

## 2023-12-31 ENCOUNTER — Telehealth: Payer: Self-pay

## 2023-12-31 NOTE — Progress Notes (Signed)
 Complex Care Management Note Care Guide Note  12/31/2023 Name: Denia Mcvicar MRN: [id number] DOB: 08/15/1966   Complex Care Management Outreach Attempts: A second unsuccessful outreach was attempted today to offer the patient with information about available complex care management services.  Follow Up Plan:  Additional outreach attempts will be made to offer the patient complex care management information and services.   Encounter Outcome:  No Answer  Monasia Lair [facility]  [facility] Guide Direct Dial: [phone]  Fax: [phone] Website: [email or web address]

## 2024-01-03 ENCOUNTER — Telehealth: Payer: Self-pay

## 2024-01-03 NOTE — Progress Notes (Signed)
 Complex Care Management Note Care Guide Note  01/03/2024 Name: Joyce Ballard MRN: [id number] DOB: April 22, 1966   Complex Care Management Outreach Attempts: A third unsuccessful outreach was attempted today to offer the patient with information about available complex care management services.  Follow Up Plan:  No further outreach attempts will be made at this time. We have been unable to contact the patient to offer or enroll patient in complex care management services.  Encounter Outcome:  No Answer  Yaviel Kloster [facility]  [facility] Guide Direct Dial: [phone]  Fax: [phone] Website: [email or web address]

## 2024-01-05 ENCOUNTER — Other Ambulatory Visit: Payer: Self-pay | Admitting: Family Medicine

## 2024-01-11 ENCOUNTER — Ambulatory Visit: Payer: Self-pay | Admitting: Family Medicine

## 2024-01-17 ENCOUNTER — Ambulatory Visit (HOSPITAL_COMMUNITY): Payer: Self-pay | Admitting: Family

## 2024-04-14 ENCOUNTER — Emergency Department (HOSPITAL_COMMUNITY)

## 2024-04-14 ENCOUNTER — Other Ambulatory Visit: Payer: Self-pay

## 2024-04-14 ENCOUNTER — Encounter (HOSPITAL_COMMUNITY): Payer: Self-pay

## 2024-04-14 ENCOUNTER — Emergency Department (HOSPITAL_COMMUNITY): Admission: EM | Admit: 2024-04-14 | Discharge: 2024-04-14 | Disposition: A | Discharge status: Home / Self Care

## 2024-04-14 DIAGNOSIS — M79601 Pain in right arm: Secondary | ICD-10-CM | POA: Diagnosis present

## 2024-04-14 DIAGNOSIS — M25511 Pain in right shoulder: Secondary | ICD-10-CM | POA: Diagnosis not present

## 2024-04-14 LAB — CBC WITH DIFFERENTIAL/PLATELET
Abs Immature Granulocytes: 0.02 10*3/uL (ref 0.00–0.07)
Basophils Absolute: 0.1 10*3/uL (ref 0.0–0.1)
Basophils Relative: 2 %
Eosinophils Absolute: 0.1 10*3/uL (ref 0.0–0.5)
Eosinophils Relative: 2 %
HCT: 35.4 % — ABNORMAL LOW (ref 36.0–46.0)
Hemoglobin: 11.9 g/dL — ABNORMAL LOW (ref 12.0–15.0)
Immature Granulocytes: 0 %
Lymphocytes Relative: 27 %
Lymphs Abs: 1.9 10*3/uL (ref 0.7–4.0)
MCH: 32.8 pg (ref 26.0–34.0)
MCHC: 33.6 g/dL (ref 30.0–36.0)
MCV: 97.5 fL (ref 80.0–100.0)
Monocytes Absolute: 0.5 10*3/uL (ref 0.1–1.0)
Monocytes Relative: 8 %
Neutro Abs: 4.3 10*3/uL (ref 1.7–7.7)
Neutrophils Relative %: 61 %
Platelets: 327 10*3/uL (ref 150–400)
RBC: 3.63 MIL/uL — ABNORMAL LOW (ref 3.87–5.11)
RDW: 14 % (ref 11.5–15.5)
WBC: 6.9 10*3/uL (ref 4.0–10.5)
nRBC: 0 % (ref 0.0–0.2)

## 2024-04-14 LAB — TROPONIN I (HIGH SENSITIVITY)
Troponin I (High Sensitivity): 6 ng/L (ref <18)
Troponin I (High Sensitivity): 4 ng/L (ref <18)

## 2024-04-14 LAB — COMPREHENSIVE METABOLIC PANEL WITH GFR
ALT: 49 U/L — ABNORMAL HIGH (ref 0–44)
AST: 68 U/L — ABNORMAL HIGH (ref 15–41)
Albumin: 3.5 g/dL (ref 3.5–5.0)
Alkaline Phosphatase: 92 U/L (ref 38–126)
Anion gap: 10 (ref 5–15)
BUN: 23 mg/dL — ABNORMAL HIGH (ref 6–20)
CO2: 22 mmol/L (ref 22–32)
Calcium: 9.1 mg/dL (ref 8.9–10.3)
Chloride: 100 mmol/L (ref 98–111)
Creatinine, Ser: 1.16 mg/dL — ABNORMAL HIGH (ref 0.44–1.00)
GFR, Estimated: 55 mL/min — ABNORMAL LOW (ref >60)
Glucose, Bld: 97 mg/dL (ref 70–99)
Potassium: 4.6 mmol/L (ref 3.5–5.1)
Sodium: 132 mmol/L — ABNORMAL LOW (ref 135–145)
Total Bilirubin: 0.8 mg/dL (ref 0.0–1.2)
Total Protein: 7 g/dL (ref 6.5–8.1)

## 2024-04-14 MED ORDER — LIDOCAINE 5 % EX PTCH: 1.0000 | MEDICATED_PATCH | CUTANEOUS | 0 refills | Status: AC

## 2024-04-14 NOTE — ED Provider Notes (Signed)
  EMERGENCY DEPARTMENT AT [facility] Provider Note   CSN: [id number] Arrival date & time: 04/14/24  3664     Patient presents with: Arm Pain and Chest Pain   Joyce Ballard is a 58 y.o. female.    Arm Pain Associated symptoms include chest pain.  Chest Pain  Patient presents due to right shoulder pain.  Patient states that right shoulder has been hurting for the past 2 weeks.  Patient states that will happen more randomly in nature.  Sometimes hurts with certain movements as well as whenever she is lying on her right shoulder.  She subsequently tried to stretch it out.  She will get a sharp shooting pain started in the right shoulder area radiating down into her elbow.  No cervical pain.  She feels like the pain seems to originate more in her shoulder/trapezius area.  No exertional chest pain.  No exertional shortness of breath.  Denies any history of DVT or PE.  No pleuritic chest pain no hemoptysis.  No recent travel history.  Patient denies any cardiac history.  Does endorse a family history of cardiac disease.    Prior to Admission medications   Medication Sig Start Date End Date Taking? Authorizing Provider  lidocaine (LIDODERM) 5 % Place 1 patch onto the skin daily. Remove & Discard patch within 12 hours or as directed by MD 04/14/24  Yes Spero Dye, MD  albuterol  (PROAIR  HFA) 108 (90 Base) MCG/ACT inhaler Inhale 1-2 puffs into the lungs every 6 (six) hours as needed for wheezing or shortness of breath. 04/13/22   Newlin, Enobong, MD  hydrochlorothiazide  (HYDRODIURIL ) 25 MG tablet Take 1 tablet (25 mg total) by mouth daily. 12/07/23 03/06/24  Sonnie Dusky, PA-C  QUEtiapine  (SEROQUEL ) 25 MG tablet Take 1 tablet (25 mg total) by mouth at bedtime as needed. 12/23/23   Carey Chapman, MD  umeclidinium bromide  (INCRUSE ELLIPTA ) 62.5 MCG/ACT AEPB Inhale 1 puff into the lungs daily. 06/18/22   Newlin, Enobong, MD  umeclidinium bromide  (INCRUSE ELLIPTA ) 62.5 MCG/ACT AEPB  Inhale 1 puff into the lungs daily. 01/11/23       Allergies: Patient has no known allergies.    Review of Systems  Cardiovascular:  Positive for chest pain.    Updated Vital Signs BP 134/81   Pulse (!) 55   Temp 98.3 F (36.8 C) (Oral)   Resp 12   SpO2 99%   Physical Exam Vitals and nursing note reviewed.  Constitutional:      General: She is not in acute distress.    Appearance: She is well-developed.  HENT:     Head: Normocephalic and atraumatic.   Eyes:     Conjunctiva/sclera: Conjunctivae normal.    Cardiovascular:     Rate and Rhythm: Normal rate and regular rhythm.     Heart sounds: No murmur heard. Pulmonary:     Effort: Pulmonary effort is normal. No respiratory distress.     Breath sounds: Normal breath sounds.  Abdominal:     Palpations: Abdomen is soft.     Tenderness: There is no abdominal tenderness.   Musculoskeletal:        General: No swelling.       Arms:     Cervical back: Neck supple.   Skin:    General: Skin is warm and dry.     Capillary Refill: Capillary refill takes less than 2 seconds.   Neurological:     Mental Status: She is alert.   Psychiatric:  Mood and Affect: Mood normal.     (all labs ordered are listed, but only abnormal results are displayed) Labs Reviewed  CBC WITH DIFFERENTIAL/PLATELET - Abnormal; Notable for the following components:      Result Value   RBC 3.63 (*)    Hemoglobin 11.9 (*)    HCT 35.4 (*)    All other components within normal limits  COMPREHENSIVE METABOLIC PANEL WITH GFR - Abnormal; Notable for the following components:   Sodium 132 (*)    BUN 23 (*)    Creatinine, Ser 1.16 (*)    AST 68 (*)    ALT 49 (*)    GFR, Estimated 55 (*)    All other components within normal limits  TROPONIN I (HIGH SENSITIVITY)  TROPONIN I (HIGH SENSITIVITY)    EKG: EKG Interpretation Date/Time:  Friday April 14 2024 08:44:20 EDT Ventricular Rate:  62 PR Interval:  140 QRS Duration:  93 QT  Interval:  411 QTC Calculation: 418 R Axis:   59  Text Interpretation: Sinus rhythm Confirmed by Faustino Hook [phone]) on 04/14/2024 8:47:11 AM  Radiology: DG Chest Portable 1 View Result Date: 04/14/2024 CLINICAL DATA:  right shoulder pain EXAM: PORTABLE CHEST - 1 VIEW COMPARISON:  December 07, 2023 FINDINGS: No focal airspace consolidation, pleural effusion, or pneumothorax. No cardiomegaly. No acute fracture or destructive lesion. IMPRESSION: No acute cardiopulmonary abnormality. Electronically Signed   By: Rance Burrows M.D.   On: 04/14/2024 11:44   DG Shoulder Right Result Date: 04/14/2024 CLINICAL DATA:  Right shoulder pain EXAM: RIGHT SHOULDER - 3 VIEW COMPARISON:  None Available. FINDINGS: There is no evidence of fracture or dislocation. There is no evidence of arthropathy or other focal bone abnormality. Soft tissues are unremarkable. IMPRESSION: No acute osseous abnormality. Electronically Signed   By: Adrianna Horde M.D.   On: 04/14/2024 10:16     Procedures   Medications Ordered in the ED - No data to display                                  Medical Decision Making Amount and/or Complexity of Data Reviewed Labs: ordered. Radiology: ordered.  Risk Prescription drug management.   Patient presents due to right shoulder pain.  Patient states that right shoulder has been hurting for the past 2 weeks.  Patient states that will happen more randomly in nature.  Sometimes hurts with certain movements as well as whenever she is lying on her right shoulder.  She subsequently tried to stretch it out.  She will get a sharp shooting pain started in the right shoulder area radiating down into her elbow.  No cervical pain.  She feels like the pain seems to originate more in her shoulder/trapezius area.  No exertional chest pain.  No exertional shortness of breath.  Denies any history of DVT or PE.  No pleuritic chest pain no hemoptysis.  No recent travel history.  Patient denies any cardiac  history.  Does endorse a family history of cardiac disease.    Upon exam, patient hemodynamically stable.  ANO x 3 GCS of 15.  No focal deficits.  Sensation intact in bilateral upper and lower extremities.  Romberg negative.   Reviewed EKG.  Sinus rhythm.  No STEMI arrhythmia.  Similar compared to prior.  Did obtain troponin x 2 as well.  High-sensitivity troponin was unremarkable.  Low risk heart score.  No further cardiac testing needed at  this point time.    Some slight reproducible pain in the right shoulder area.  It is intact distal to the injury site.  Sensation is intact distal to the injury site.  Seem to be worse with some abduction as well as external rotation.  Did question whether or not there could be some form of peripheral neuropathy at times given that she endorses a sharp shooting pain down her shoulder.  Does not seem to be originating from the neck.  No bowel or bladder continence.  No difficulty walking.  No bilateral arm numbness or dropping objects from her bilateral hands have suggest any Central cord syndrome.  No indication for CT imaging of the cervical spine.  X-ray of the shoulder did not show any kind of acute pathology.  No obvious fracture.  I do think she should follow-up with orthopedic surgery.  I did call and lidocaine patches and recommended ibuprofen  going forward.   Return precautions given.       Final diagnoses:  Acute pain of right shoulder  Right arm pain    ED Discharge Orders          Ordered    lidocaine (LIDODERM) 5 %  Every 24 hours        04/14/24 1209               Spero Dye, MD 04/14/24 1215

## 2024-04-14 NOTE — ED Triage Notes (Addendum)
 PT arrives via POV. Pt reports for the past couple of weeks, she has been experiencing pain in her right shoulder. States pain will shoot down her arm and experiences tingling in her fingers. Pt reports she will occasionally have some chest discomfort with the arm pain. Pt is AxOx4. Denies injury to her arm. States she is not experiencing chest pain at this time.

## 2024-04-14 NOTE — Discharge Instructions (Addendum)
 I do think you should follow up with orthopedic surgery. Please give them a call. They may want to obtain a MRI of your shoulder going forward.   If you ever have any worsening chest pain or shortness of breath then please come back to the ED.   I do think this is some degree of radiculopathy which is likely originating from your shoulder area. I did call in a prescription for a lidocaine patch. You can also take ibuprofen  for the pain.

## 2024-04-14 NOTE — ED Notes (Signed)
 ED Provider at bedside.

## 2024-04-18 ENCOUNTER — Encounter: Payer: Self-pay | Admitting: Physician Assistant

## 2024-04-18 ENCOUNTER — Ambulatory Visit: Admitting: Physician Assistant

## 2024-04-18 ENCOUNTER — Other Ambulatory Visit (INDEPENDENT_AMBULATORY_CARE_PROVIDER_SITE_OTHER): Payer: Self-pay

## 2024-04-18 DIAGNOSIS — M542 Cervicalgia: Secondary | ICD-10-CM | POA: Diagnosis not present

## 2024-04-18 DIAGNOSIS — M25511 Pain in right shoulder: Secondary | ICD-10-CM | POA: Diagnosis not present

## 2024-04-18 MED ORDER — METHYLPREDNISOLONE 4 MG PO TBPK: ORAL_TABLET | ORAL | 0 refills | Status: AC

## 2024-04-18 MED ORDER — METHOCARBAMOL 500 MG PO TABS: 500.0000 mg | ORAL_TABLET | Freq: Four times a day (QID) | ORAL | 0 refills | Status: AC | PRN

## 2024-04-18 NOTE — Progress Notes (Signed)
 Office Visit Note   Patient: Joyce Ballard           Date of Birth: [birth year]           MRN: [id number] Visit Date: 04/18/2024              Requested by: Diona Perkins, MD [address] [place],  [place] [address] PCP: Diona Perkins, MD   Assessment & Plan: Visit Diagnoses:  1. Acute pain of right shoulder   2. Neck pain     Plan: Patient is a pleasant 58 year old woman who comes in today with a chief complaint of 2-week history of pain in her shoulder and her neck.  Radiographs she does have some paresthesias and has reproduction of her symptoms with range of motion of her neck.  She really does not have any impingement findings in her shoulder.  I would favor that this is coming from her cervical spine rather than her shoulder.  Would like her to try a course of steroids which she has taken before for her COPD.  She knows not to take other anti-inflammatories with it and to take it with food.  Also will place her on a muscle relaxant.  Should follow-up with me in 2 weeks  Follow-Up Instructions: Return in about 2 weeks (around 05/02/2024).   Orders:  Orders Placed This Encounter  Procedures   XR Cervical Spine 2 or 3 views   Meds ordered this encounter  Medications   methylPREDNISolone (MEDROL DOSEPAK) 4 MG TBPK tablet    Sig: Take as directed with food    Dispense:  21 tablet    Refill:  0   methocarbamol (ROBAXIN) 500 MG tablet    Sig: Take 1 tablet (500 mg total) by mouth every 6 (six) hours as needed for muscle spasms.    Dispense:  30 tablet    Refill:  0      Procedures: No procedures performed   Clinical Data: No additional findings.   Subjective: No chief complaint on file.   HPI patient is a 58 year old woman with 2-week history of right shoulder pain.  She is not taking any pain meds not help.  She has had to put her arm up on her head to relieve the pain wakes up out of the sleep has range of motion range of motion makes pain better denies any  paresthesias   Review of Systems  All other systems reviewed and are negative.    Objective: Vital Signs: There were no vitals taken for this visit.  Physical Exam Pulmonary:     Effort: Pulmonary effort is normal.   Skin:    General: Skin is warm and dry.   Neurological:     General: No focal deficit present.     Mental Status: She is alert and oriented to person, place, and time.   Psychiatric:        Mood and Affect: Mood normal.        Behavior: Behavior normal.     Ortho Exam Examination of her shoulder she has full range of motion no impingement findings.  She does have a lot of tenderness in the paravertebral muscles.  Pain running down her shoulders especially the right to her hand is reproduced with flexion of her neck and extension and turning her head side-to-side.  Also has some left shoulder findings.  Strength is intact with her biceps triceps and grip strength. Specialty Comments:  No specialty comments available.  Imaging: XR  Cervical Spine 2 or 3 views Result Date: 04/18/2024 Radiograph of her cervical spine demonstrate overall well-maintained spacing she does have loss of the normal lordotic curve    PMFS History: Patient Active Problem List   Diagnosis Date Noted   Pain in right shoulder 04/18/2024   Anxiety 12/25/2023   Hypertension 11/19/2021   Chronic obstructive pulmonary disease (HCC) 11/19/2021   Abnormal CXR 11/19/2021   Past Medical History:  Diagnosis Date   Anxiety    COPD (chronic obstructive pulmonary disease) (HCC)    Depression    Hypertension    PTSD (post-traumatic stress disorder)     Family History  Problem Relation Age of Onset   Hypertension Mother    Cancer Mother    Depression Mother    Post-traumatic stress disorder Mother     Past Surgical History:  Procedure Laterality Date   TUBAL LIGATION     Social History   Occupational History   Not on file  Tobacco Use   Smoking status: Every Day    Current  packs/day: 1.00    Types: Cigarettes   Smokeless tobacco: Never  Vaping Use   Vaping status: Never Used  Substance and Sexual Activity   Alcohol use: Yes    Alcohol/week: 21.0 standard drinks of alcohol    Types: 21 Cans of beer per week    Comment: beer daily   Drug use: Yes    Types: Marijuana   Sexual activity: Not on file

## 2024-06-28 ENCOUNTER — Encounter: Admit: 2024-06-28 | Payer: PRIVATE HEALTH INSURANCE

## 2024-06-28 DIAGNOSIS — J449 Chronic obstructive pulmonary disease, unspecified: Secondary | ICD-10-CM

## 2024-06-28 DIAGNOSIS — J439 Emphysema, unspecified: Secondary | ICD-10-CM

## 2024-06-28 DIAGNOSIS — F39 Unspecified mood [affective] disorder: Secondary | ICD-10-CM

## 2024-06-28 DIAGNOSIS — I10 Essential (primary) hypertension: Secondary | ICD-10-CM

## 2024-06-28 DIAGNOSIS — F1910 Other psychoactive substance abuse, uncomplicated: Secondary | ICD-10-CM

## 2024-06-28 DIAGNOSIS — F419 Anxiety disorder, unspecified: Secondary | ICD-10-CM

## 2024-06-28 DIAGNOSIS — F32A Depression: Principal | ICD-10-CM

## 2024-08-28 ENCOUNTER — Encounter: Payer: Self-pay | Admitting: Radiology

## 2024-09-02 ENCOUNTER — Telehealth (HOSPITAL_COMMUNITY): Payer: Self-pay | Admitting: Emergency Medicine

## 2024-09-02 ENCOUNTER — Encounter (HOSPITAL_COMMUNITY): Payer: Self-pay | Admitting: Emergency Medicine

## 2024-09-02 ENCOUNTER — Other Ambulatory Visit: Payer: Self-pay

## 2024-09-02 ENCOUNTER — Emergency Department (HOSPITAL_COMMUNITY)
Admission: EM | Admit: 2024-09-02 | Discharge: 2024-09-02 | Disposition: A | Discharge status: Home / Self Care | Attending: Emergency Medicine | Admitting: Emergency Medicine

## 2024-09-02 DIAGNOSIS — F17210 Nicotine dependence, cigarettes, uncomplicated: Secondary | ICD-10-CM | POA: Diagnosis not present

## 2024-09-02 DIAGNOSIS — R0789 Other chest pain: Secondary | ICD-10-CM | POA: Diagnosis present

## 2024-09-02 DIAGNOSIS — I10 Essential (primary) hypertension: Secondary | ICD-10-CM | POA: Diagnosis not present

## 2024-09-02 DIAGNOSIS — J449 Chronic obstructive pulmonary disease, unspecified: Secondary | ICD-10-CM | POA: Insufficient documentation

## 2024-09-02 DIAGNOSIS — Z79899 Other long term (current) drug therapy: Secondary | ICD-10-CM | POA: Insufficient documentation

## 2024-09-02 LAB — CBC WITH DIFFERENTIAL/PLATELET
Abs Immature Granulocytes: 0.02 K/uL (ref 0.00–0.07)
Basophils Absolute: 0.1 K/uL (ref 0.0–0.1)
Basophils Relative: 1 %
Eosinophils Absolute: 0 K/uL (ref 0.0–0.5)
Eosinophils Relative: 0 %
HCT: 37.6 % (ref 36.0–46.0)
Hemoglobin: 12.2 g/dL (ref 12.0–15.0)
Immature Granulocytes: 0 %
Lymphocytes Relative: 26 %
Lymphs Abs: 2.4 K/uL (ref 0.7–4.0)
MCH: 31.3 pg (ref 26.0–34.0)
MCHC: 32.4 g/dL (ref 30.0–36.0)
MCV: 96.4 fL (ref 80.0–100.0)
Monocytes Absolute: 0.8 K/uL (ref 0.1–1.0)
Monocytes Relative: 8 %
Neutro Abs: 6 K/uL (ref 1.7–7.7)
Neutrophils Relative %: 65 %
Platelets: 396 K/uL (ref 150–400)
RBC: 3.9 MIL/uL (ref 3.87–5.11)
RDW: 12.8 % (ref 11.5–15.5)
WBC: 9.2 K/uL (ref 4.0–10.5)
nRBC: 0 % (ref 0.0–0.2)

## 2024-09-02 LAB — BASIC METABOLIC PANEL WITH GFR
Anion gap: 13 (ref 5–15)
BUN: 19 mg/dL (ref 6–20)
CO2: 23 mmol/L (ref 22–32)
Calcium: 9.9 mg/dL (ref 8.9–10.3)
Chloride: 100 mmol/L (ref 98–111)
Creatinine, Ser: 1.39 mg/dL — ABNORMAL HIGH (ref 0.44–1.00)
GFR, Estimated: 43 mL/min — ABNORMAL LOW (ref >60)
Glucose, Bld: 101 mg/dL — ABNORMAL HIGH (ref 70–99)
Potassium: 4.4 mmol/L (ref 3.5–5.1)
Sodium: 136 mmol/L (ref 135–145)

## 2024-09-02 LAB — TROPONIN T, HIGH SENSITIVITY: Troponin T High Sensitivity: <15 ng/L (ref 0–19)

## 2024-09-02 MED ORDER — HYDROCHLOROTHIAZIDE 25 MG PO TABS
25.0000 mg | ORAL_TABLET | Freq: Once | ORAL | Status: AC
  Administered 2024-09-02: 25 mg via ORAL
  Filled 2024-09-02: qty 1

## 2024-09-02 MED ORDER — HYDROCHLOROTHIAZIDE 25 MG PO TABS: 25.0000 mg | ORAL_TABLET | Freq: Every day | ORAL | 0 refills | Status: AC

## 2024-09-02 MED ORDER — HYDROCHLOROTHIAZIDE 25 MG PO TABS: 25.0000 mg | ORAL_TABLET | Freq: Every day | ORAL | 0 refills | Status: DC

## 2024-09-02 NOTE — ED Triage Notes (Signed)
 Patient presents due to high BP. She has not taking any medication since February of this years. She has been prescribed Hydrochlorothiazide  which she says ran out. She also headache the past 4 days, head fuzziness and chest discomfort.     HX Hypertension, COPD  EMS vitals: 86 HR 99% SPO2 on room air 170/100 BP

## 2024-09-02 NOTE — ED Provider Notes (Signed)
 [place] EMERGENCY DEPARTMENT AT [facility] Provider Note  CSN: [id number] Arrival date & time: 09/02/24 1830  Chief Complaint(s) Hypertension  HPI Joyce Ballard is a 58 y.o. female who is here today for elevated blood pressure.  Patient reports that she has been prescribed HCTZ, but has not taken it for some time.  She states that she has had head fuzziness as well as some chest discomfort.  Symptoms began 4 days ago.  No trouble with her vision, no headache.   Past Medical History Past Medical History:  Diagnosis Date   Anxiety    COPD (chronic obstructive pulmonary disease) (HCC)    Depression    Hypertension    PTSD (post-traumatic stress disorder)    Patient Active Problem List   Diagnosis Date Noted   Pain in right shoulder 04/18/2024   Anxiety 12/25/2023   Hypertension 11/19/2021   Chronic obstructive pulmonary disease (HCC) 11/19/2021   Abnormal CXR 11/19/2021   Home Medication(s) Prior to Admission medications   Medication Sig Start Date End Date Taking? Authorizing Provider  hydrochlorothiazide  (HYDRODIURIL ) 25 MG tablet Take 1 tablet (25 mg total) by mouth daily. 09/02/24  Yes Mannie Pac T, DO  albuterol  (PROAIR  HFA) 108 (90 Base) MCG/ACT inhaler Inhale 1-2 puffs into the lungs every 6 (six) hours as needed for wheezing or shortness of breath. 04/13/22   Newlin, Enobong, MD  lidocaine  (LIDODERM ) 5 % Place 1 patch onto the skin daily. Remove & Discard patch within 12 hours or as directed by MD 04/14/24   Simon Lavonia SAILOR, MD  methocarbamol  (ROBAXIN ) 500 MG tablet Take 1 tablet (500 mg total) by mouth every 6 (six) hours as needed for muscle spasms. 04/18/24   Persons, Ronal Dragon, PA  methylPREDNISolone  (MEDROL  DOSEPAK) 4 MG TBPK tablet Take as directed with food 04/18/24   Persons, Ronal Dragon, PA  QUEtiapine  (SEROQUEL ) 25 MG tablet Take 1 tablet (25 mg total) by mouth at bedtime as needed. 12/23/23   Diona Perkins, MD  umeclidinium bromide  (INCRUSE ELLIPTA )  62.5 MCG/ACT AEPB Inhale 1 puff into the lungs daily. 06/18/22   Newlin, Enobong, MD  umeclidinium bromide  (INCRUSE ELLIPTA ) 62.5 MCG/ACT AEPB Inhale 1 puff into the lungs daily. 01/11/23                                                                                                                                       Past Surgical History Past Surgical History:  Procedure Laterality Date   TUBAL LIGATION     Family History Family History  Problem Relation Age of Onset   Hypertension Mother    Cancer Mother    Depression Mother    Post-traumatic stress disorder Mother     Social History Social History   Tobacco Use   Smoking status: Every Day    Current packs/day: 1.00    Types: Cigarettes   Smokeless tobacco: Never  Vaping  Use   Vaping status: Never Used  Substance Use Topics   Alcohol use: Yes    Alcohol/week: 21.0 standard drinks of alcohol    Types: 21 Cans of beer per week    Comment: beer daily   Drug use: Yes    Types: Marijuana   Allergies Patient has no known allergies.  Review of Systems Review of Systems  Physical Exam Vital Signs  I have reviewed the triage vital signs BP (!) 148/85   Pulse 73   Temp 98.2 F (36.8 C) (Oral)   Resp 14   SpO2 96%   Physical Exam Vitals and nursing note reviewed.  Eyes:     Pupils: Pupils are equal, round, and reactive to light.  Cardiovascular:     Rate and Rhythm: Normal rate.     Pulses: Normal pulses.  Abdominal:     General: Abdomen is flat.     Palpations: Abdomen is soft.  Musculoskeletal:        General: Normal range of motion.     Cervical back: Normal range of motion.  Skin:    General: Skin is warm.  Neurological:     General: No focal deficit present.     Mental Status: She is alert and oriented to person, place, and time.     ED Results and Treatments Labs (all labs ordered are listed, but only abnormal results are displayed) Labs Reviewed  BASIC METABOLIC PANEL WITH GFR - Abnormal;  Notable for the following components:      Result Value   Glucose, Bld 101 (*)    Creatinine, Ser 1.39 (*)    GFR, Estimated 43 (*)    All other components within normal limits  CBC WITH DIFFERENTIAL/PLATELET  TROPONIN T, HIGH SENSITIVITY                                                                                                                          Radiology No results found.  Pertinent labs & imaging results that were available during my care of the patient were reviewed by me and considered in my medical decision making (see MDM for details).  Medications Ordered in ED Medications  hydrochlorothiazide  (HYDRODIURIL ) tablet 25 mg (25 mg Oral Given 09/02/24 1911)  Procedures Procedures  (including critical care time)  Medical Decision Making / ED Course   This patient presents to the ED for concern of hypertension, this involves an extensive number of treatment options, and is a complaint that carries with it a high risk of complications and morbidity.  The differential diagnosis includes benign hypertension, hypertensive urgency,  MDM: Likely benign hypertension.  Patient's blood pressure only 155/99.  Will provide her with her HCTZ.  Will check an EKG, single troponin given duration of symptoms, BMP.  Reassessment 7:35 PM-patient's troponin not elevated, given duration of symptoms, believe she is appropriate for a single troponin.  Patient's creatinine is within her baseline.  No anemia.  She does not have any chest pain.  Patient appropriate for discharge.  Will discharge with prescription for HCTZ, well for follow-up with her PCP.   Additional history obtained: -Additional history obtained from EMS -External records from outside source obtained and reviewed including: Chart review including previous notes, labs, imaging, consultation  notes   Lab Tests: -I ordered, reviewed, and interpreted labs.   The pertinent results include:   Labs Reviewed  BASIC METABOLIC PANEL WITH GFR - Abnormal; Notable for the following components:      Result Value   Glucose, Bld 101 (*)    Creatinine, Ser 1.39 (*)    GFR, Estimated 43 (*)    All other components within normal limits  CBC WITH DIFFERENTIAL/PLATELET  TROPONIN T, HIGH SENSITIVITY      EKG sinus rhythm, ST segment elevations unchanged from prior.  EKG Interpretation Date/Time:  Saturday September 02 2024 18:51:52 EST Ventricular Rate:  72 PR Interval:  141 QRS Duration:  89 QT Interval:  408 QTC Calculation: 447 R Axis:   72  Text Interpretation: Sinus rhythm Probable left atrial enlargement ST elevation similar to 12/07/2023 No acute changes Confirmed by Mannie Pac [phone]) on 09/02/2024 7:34:05 PM         Medicines ordered and prescription drug management: Meds ordered this encounter  Medications   hydrochlorothiazide  (HYDRODIURIL ) tablet 25 mg   hydrochlorothiazide  (HYDRODIURIL ) 25 MG tablet    Sig: Take 1 tablet (25 mg total) by mouth daily.    Dispense:  30 tablet    Refill:  0    -I have reviewed the patients home medicines and have made adjustments as needed    Cardiac Monitoring: The patient was maintained on a cardiac monitor.  I personally viewed and interpreted the cardiac monitored which showed an underlying rhythm of: Normal sinus rhythm  Social Determinants of Health:  Factors impacting patients care include: Access to primary care   Reevaluation: After the interventions noted above, I reevaluated the patient and found that they have :improved  Co morbidities that complicate the patient evaluation  Past Medical History:  Diagnosis Date   Anxiety    COPD (chronic obstructive pulmonary disease) (HCC)    Depression    Hypertension    PTSD (post-traumatic stress disorder)       Dispostion: I considered admission for this  patient, however she is appropriate for discharge.     Final Clinical Impression(s) / ED Diagnoses Final diagnoses:  Primary hypertension  Uncontrolled hypertension     @PCDICTATION @    Mannie Pac T, DO 09/02/24 1940

## 2024-09-02 NOTE — Telephone Encounter (Signed)
Meds changed to new pharmacy.

## 2024-09-02 NOTE — ED Notes (Signed)
 She reports no headache of chest pain at this time.

## 2024-09-02 NOTE — Discharge Instructions (Signed)
